# Patient Record
Sex: Female | Born: 1975 | Race: White | Hispanic: No | Marital: Married | Smoking: Former smoker
Health system: Southern US, Community
[De-identification: ages and names within clinical notes are randomized; demographics above are authoritative.]

## PROBLEM LIST (undated history)

## (undated) DIAGNOSIS — G459 Transient cerebral ischemic attack, unspecified: Secondary | ICD-10-CM

## (undated) DIAGNOSIS — N809 Endometriosis, unspecified: Secondary | ICD-10-CM

## (undated) DIAGNOSIS — E282 Polycystic ovarian syndrome: Secondary | ICD-10-CM

## (undated) DIAGNOSIS — F329 Major depressive disorder, single episode, unspecified: Secondary | ICD-10-CM

## (undated) DIAGNOSIS — I10 Essential (primary) hypertension: Secondary | ICD-10-CM

## (undated) DIAGNOSIS — D6851 Activated protein C resistance: Secondary | ICD-10-CM

## (undated) DIAGNOSIS — F419 Anxiety disorder, unspecified: Secondary | ICD-10-CM

## (undated) DIAGNOSIS — F32A Depression, unspecified: Secondary | ICD-10-CM

## (undated) HISTORY — PX: TYMPANOSTOMY TUBE PLACEMENT: SHX32

## (undated) HISTORY — DX: Anxiety disorder, unspecified: F41.9

## (undated) HISTORY — DX: Depression, unspecified: F32.A

## (undated) HISTORY — DX: Major depressive disorder, single episode, unspecified: F32.9

## (undated) HISTORY — DX: Activated protein C resistance: D68.51

## (undated) HISTORY — PX: OTHER SURGICAL HISTORY: SHX169

## (undated) HISTORY — DX: Polycystic ovarian syndrome: E28.2

## (undated) HISTORY — DX: Transient cerebral ischemic attack, unspecified: G45.9

## (undated) HISTORY — DX: Endometriosis, unspecified: N80.9

## (undated) HISTORY — DX: Essential (primary) hypertension: I10

---

## 2000-05-09 HISTORY — PX: CHOLECYSTECTOMY: SHX55

## 2003-10-08 ENCOUNTER — Emergency Department (HOSPITAL_COMMUNITY): Admission: EM | Admit: 2003-10-08 | Discharge: 2003-10-09 | Payer: Self-pay | Admitting: Emergency Medicine

## 2008-02-26 ENCOUNTER — Encounter: Admission: RE | Admit: 2008-02-26 | Discharge: 2008-04-11 | Payer: Self-pay | Admitting: Family Medicine

## 2008-07-22 ENCOUNTER — Ambulatory Visit: Payer: Self-pay | Admitting: Occupational Medicine

## 2008-07-22 DIAGNOSIS — K146 Glossodynia: Secondary | ICD-10-CM

## 2009-07-21 ENCOUNTER — Other Ambulatory Visit: Admission: RE | Admit: 2009-07-21 | Discharge: 2009-07-21 | Payer: Self-pay | Admitting: Obstetrics & Gynecology

## 2009-07-21 ENCOUNTER — Ambulatory Visit: Payer: Self-pay | Admitting: Obstetrics & Gynecology

## 2009-07-22 ENCOUNTER — Encounter: Payer: Self-pay | Admitting: Obstetrics & Gynecology

## 2009-07-22 LAB — CONVERTED CEMR LAB
DHEA-SO4: 163 ug/dL (ref 35–430)
hCG, Beta Chain, Quant, S: <2 milliintl units/mL

## 2009-07-29 ENCOUNTER — Encounter: Admission: RE | Admit: 2009-07-29 | Discharge: 2009-07-29 | Payer: Self-pay | Admitting: Obstetrics & Gynecology

## 2009-08-05 ENCOUNTER — Ambulatory Visit: Payer: Self-pay | Admitting: Obstetrics & Gynecology

## 2009-08-05 LAB — CONVERTED CEMR LAB: Hgb A1c MFr Bld: 5.4 % (ref 4.6–6.1)

## 2009-08-18 ENCOUNTER — Ambulatory Visit: Payer: Self-pay | Admitting: Obstetrics & Gynecology

## 2010-05-30 ENCOUNTER — Encounter: Payer: Self-pay | Admitting: Obstetrics & Gynecology

## 2010-07-09 ENCOUNTER — Ambulatory Visit (INDEPENDENT_AMBULATORY_CARE_PROVIDER_SITE_OTHER): Payer: Medicaid Other | Admitting: Behavioral Health

## 2010-07-09 DIAGNOSIS — F331 Major depressive disorder, recurrent, moderate: Secondary | ICD-10-CM

## 2010-07-09 DIAGNOSIS — F411 Generalized anxiety disorder: Secondary | ICD-10-CM

## 2010-07-20 ENCOUNTER — Encounter (INDEPENDENT_AMBULATORY_CARE_PROVIDER_SITE_OTHER): Payer: Medicaid Other | Admitting: Behavioral Health

## 2010-07-20 DIAGNOSIS — F331 Major depressive disorder, recurrent, moderate: Secondary | ICD-10-CM

## 2010-07-20 DIAGNOSIS — F411 Generalized anxiety disorder: Secondary | ICD-10-CM

## 2010-08-04 ENCOUNTER — Encounter (INDEPENDENT_AMBULATORY_CARE_PROVIDER_SITE_OTHER): Payer: Medicaid Other | Admitting: Behavioral Health

## 2010-08-04 DIAGNOSIS — F331 Major depressive disorder, recurrent, moderate: Secondary | ICD-10-CM

## 2010-08-04 DIAGNOSIS — F411 Generalized anxiety disorder: Secondary | ICD-10-CM

## 2010-08-18 ENCOUNTER — Encounter (HOSPITAL_COMMUNITY): Payer: Medicaid Other | Admitting: Behavioral Health

## 2010-09-01 ENCOUNTER — Encounter (INDEPENDENT_AMBULATORY_CARE_PROVIDER_SITE_OTHER): Payer: Medicaid Other | Admitting: Behavioral Health

## 2010-09-01 DIAGNOSIS — F331 Major depressive disorder, recurrent, moderate: Secondary | ICD-10-CM

## 2010-09-12 IMAGING — US US TRANSVAGINAL NON-OB
1 series · 14 of 25 positions shown · non-contrast
Comparison: None

CLINICAL DATA: Amenorrhea.  Pelvic pain. Obesity.  Polycystic
ovarian syndrome.

TRANSABDOMINAL AND TRANSVAGINAL ULTRASOUND OF PELVIS
TECHNIQUE: Both transabdominal and transvaginal ultrasound
examinations of the pelvis were performed including evaluation of
the uterus, ovaries, adnexal regions, and pelvic cul-de-sac.

[Series 1: us transvaginal non-ob · 0.26mm/px · 14 of 44 slices shown]
[im 1/44]
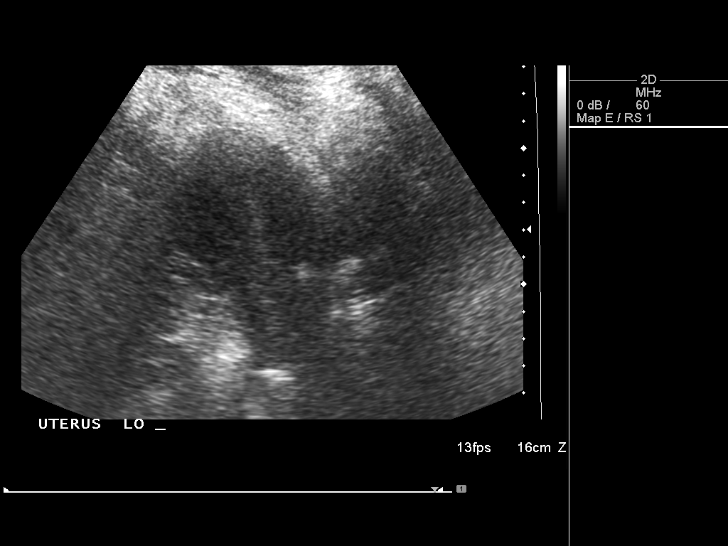
[im 4/44]
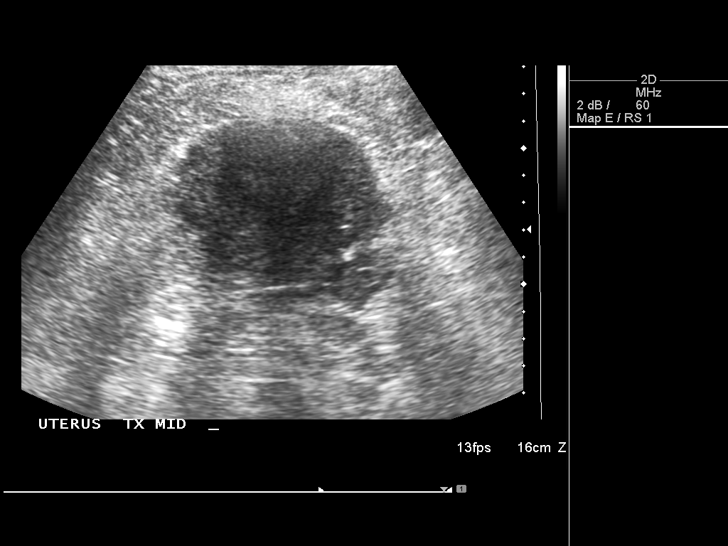
[im 8/44]
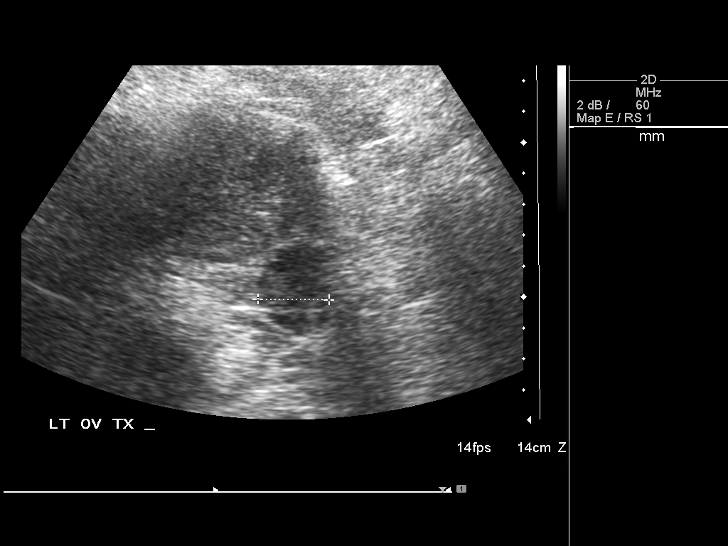
[im 11/44]
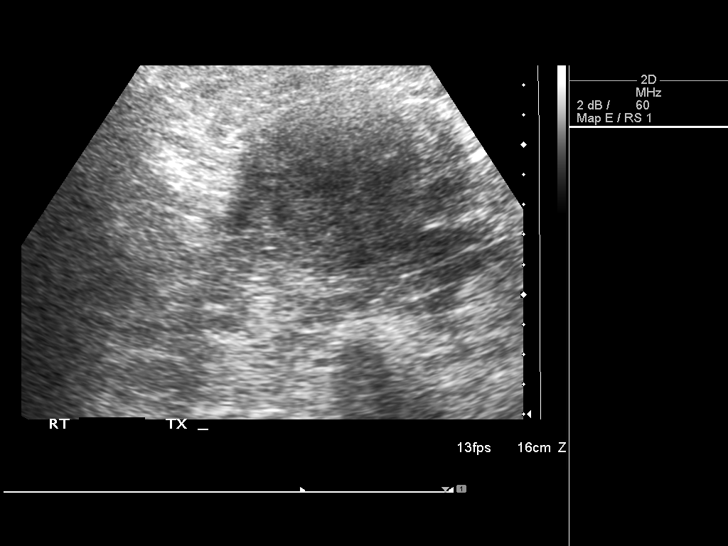
[im 15/44]
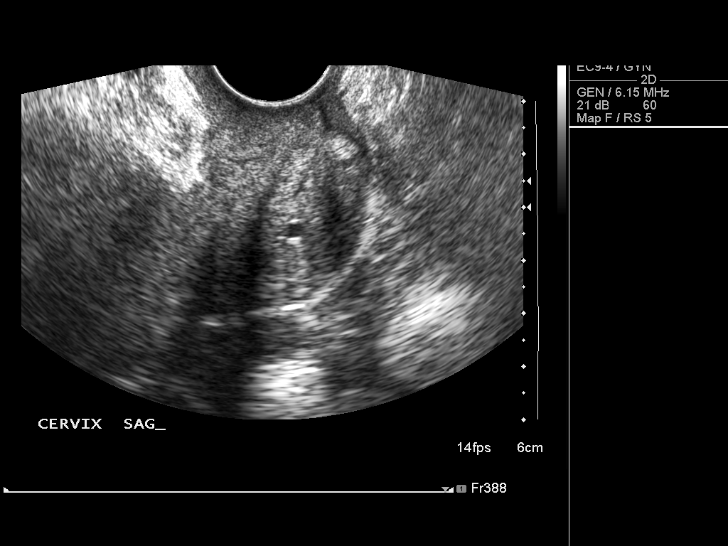
[im 17/44]
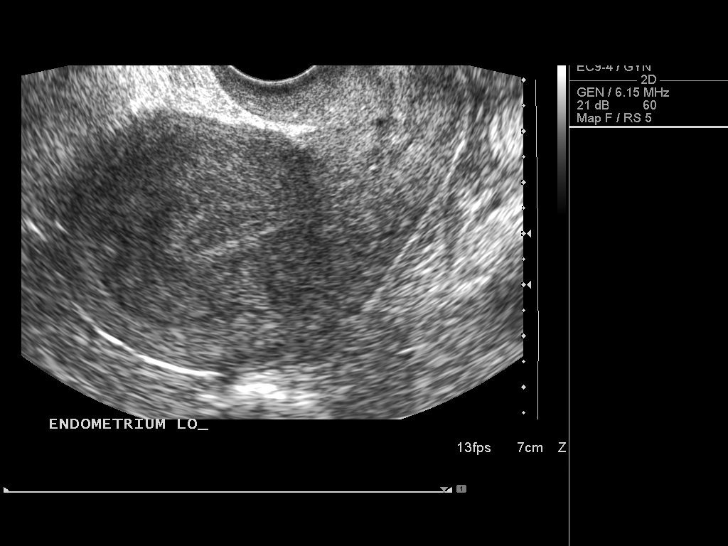
[im 20/44]
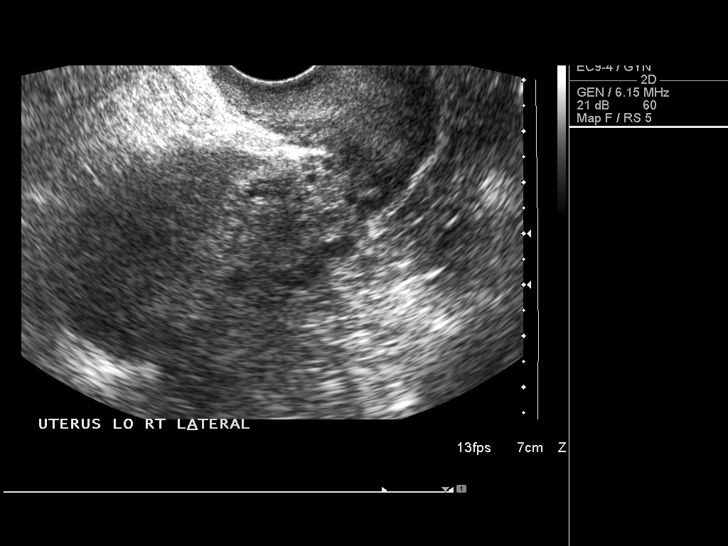
[im 24/44]
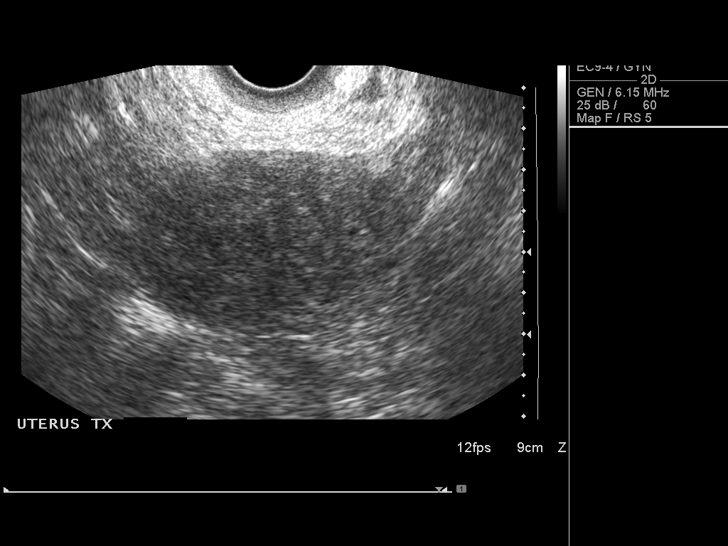
[im 27/44]
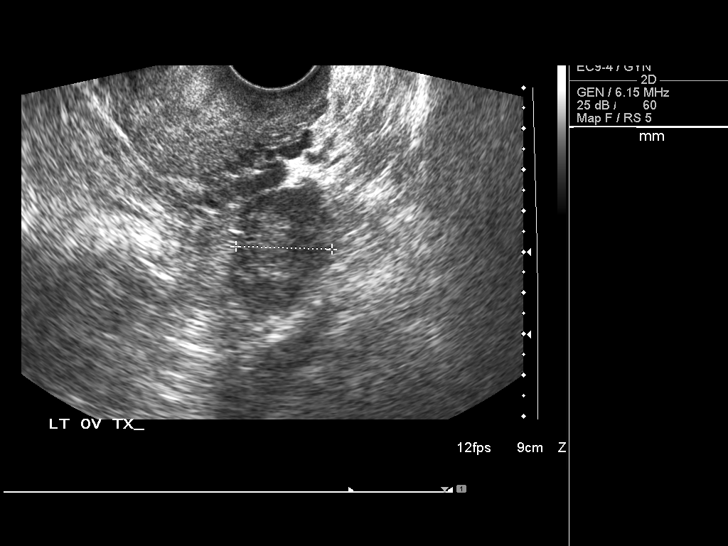
[im 29/44]
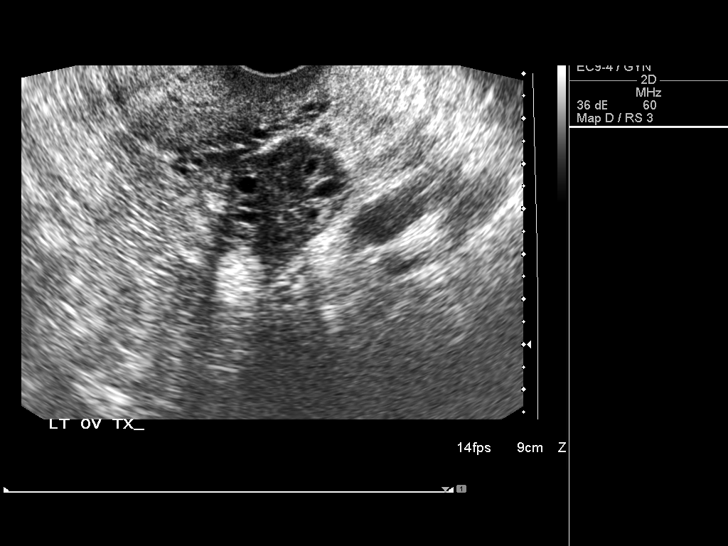
[im 33/44]
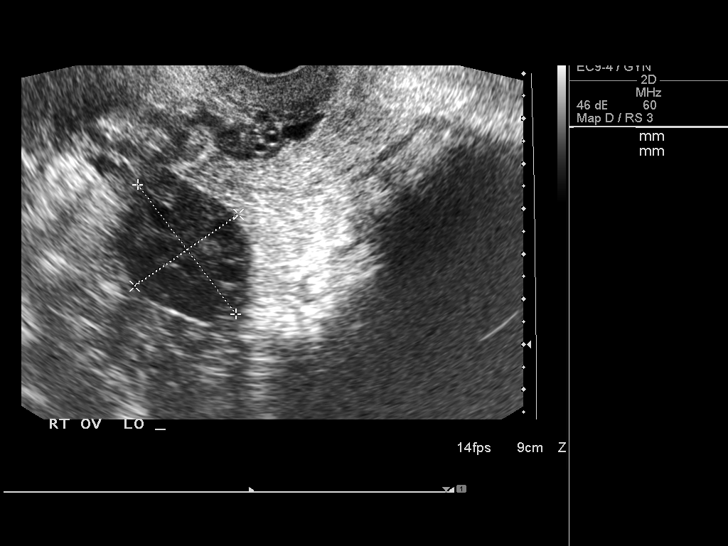
[im 36/44]
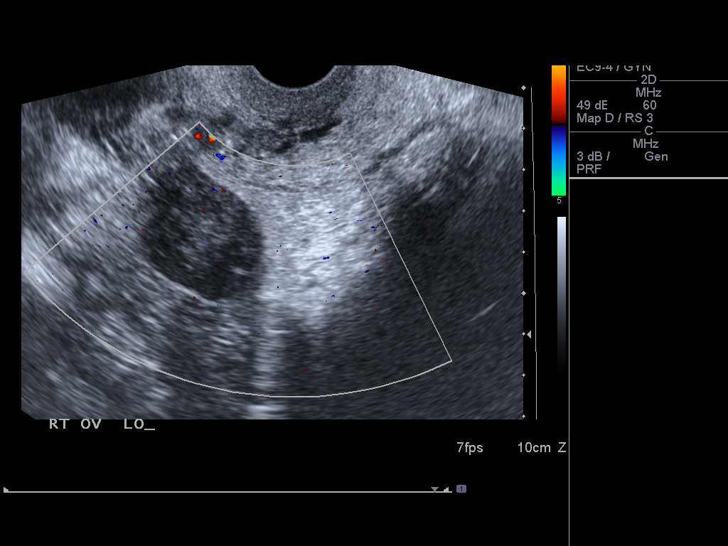
[im 40/44]
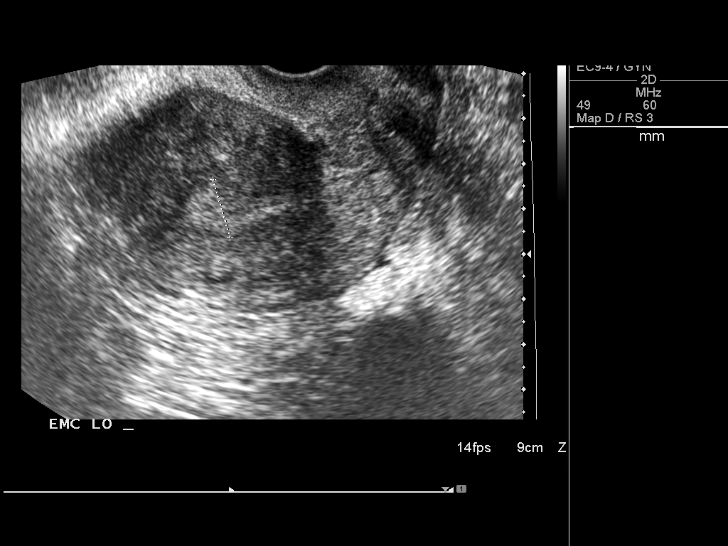
[im 44/44]
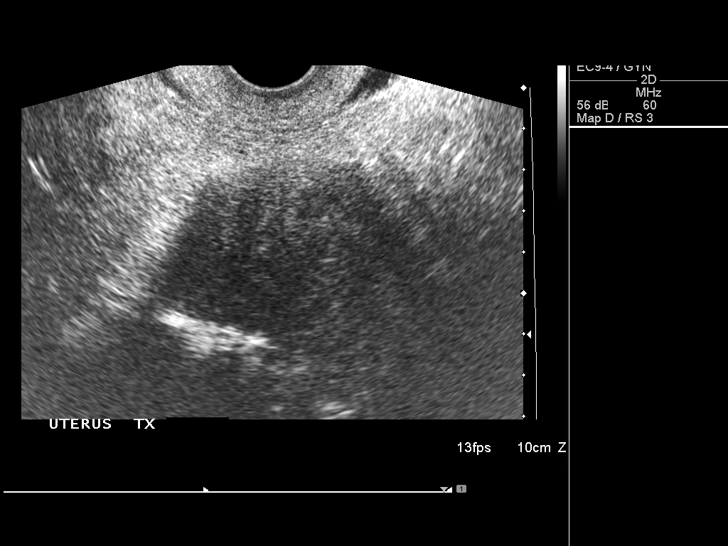

[14 of 25 positions shown; findings below may reference images not displayed]

FINDINGS: Uterus measures 9.7 x 4.9 x 7.0 cm. No fibroids or other uterine
masses identified.

Endometrium measures 13 mm in thickness.  Within normal limits in
appearance.

Right Ovary measures 3.6 x 2.8 x 2.4 cm. Normal appearance.
Multiple tiny less than 5 mm follicles noted, without evidence of
dominant follicle or corpus luteum.

Left Ovary measures 3.1 x 3.2 x 2.3 cm.  Normal appearance.
Multiple tiny less than 5 mm follicles noted, without evidence of
dominant follicle or corpus luteum.

Other Findings:  No adnexal mass or free fluid identified.
IMPRESSION: 1.  Multiple tiny bilateral ovarian follicles, without evidence of
dominant follicle or corpus luteum.  These findings are consistent
with polycystic ovarian syndrome.
2.  Endometrial thickness measures 13 mm.
3.  No evidence of fibroids or adnexal mass.

## 2010-09-16 ENCOUNTER — Encounter (INDEPENDENT_AMBULATORY_CARE_PROVIDER_SITE_OTHER): Payer: Medicaid Other | Admitting: Behavioral Health

## 2010-09-16 DIAGNOSIS — F331 Major depressive disorder, recurrent, moderate: Secondary | ICD-10-CM

## 2010-09-21 NOTE — Assessment & Plan Note (Signed)
Suzanne Crawford, Suzanne Crawford                ACCOUNT NO.:  [id number]   MEDICAL RECORD NO.:  [id number]          PATIENT TYPE:  POB   LOCATION:  [facility] at [place]         FACILITY:  [facility]   PHYSICIAN:  Elsie Lincoln, MD      DATE OF BIRTH:  June 10, 1975   DATE OF SERVICE:  07/21/2009                                  CLINIC NOTE   The patient is a 35 year old female who presents for GYN visit.  This  patient has not seen a GYN since 2006.  She has complaints of pelvic  pain, irregular cycles, and a history of PCOS.  I decided to do a yearly  exam and will address all of her complaints at future visits, first saw  the patient because 4-5 months without having a cycle and then a very  heavy bleeding.  She has pelvic pain that can be severe at times, but is  not currently severe.  She is worried that she has some type of cancer.  Her mother had hysterectomy very early for a very rare GYN cancer, but  she does not know what it is.  Lot of her aunts and cousins have had  hysterectomies for cervical cancer and ruptured cyst, but not ovarian or  uterine cancer that she is aware of other than her mother having a rare  cancer.  She said her mother will not be able to figure out what type of  cancer she had.  The patient is not sexually active.   PAST MEDICAL HISTORY:  Complex.  The patient has high blood pressure,  some anxiety, and emotional eating.  She has psoriasis and has had  recent recurrent cellulitis in her legs and currently has cellulitis and  is going to her primary care doctor today to get thorough realm of  antibiotics.  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  She is also morbidly obese.   PAST SURGICAL HISTORY:  She has had a C-section for what is supposed to  be a breech delivery, ended to be vertex when they cut her.  She has had  three ear surgeries, had tumors and tubes placed in her ears.  She also  has had a cholecystectomy.   GYNECOLOGIC  HISTORY:  Significant for polycystic ovarian syndrome with  irregular cycles most of her life.  Most recently, she had cycles every  4-5 months with very heavy bleeds.  She has had ovarian cyst in the  past.  Her last Pap smear was abnormal in 2006, but not good followup.  She has had PID in the late 90s.  She denies any other sexually  transmitted diseases.  She has never been told she has had fibroid  tumors of the uterus.  She has never been told she has had  endometriosis.  She is not currently sexually active.  She was in a  heterosexual relationship.  She seems she has had four pregnancies even  though she has had PCOS.  She had three vaginal deliveries and one C-  section.   SOCIAL HISTORY:  She is not employed.  She lives with her mother and 4  kids.  She does not smoke, drink alcohol, or do drugs.  She has not been  a victim of sexual or physical abuse.   FAMILY HISTORY:  Significant for diabetes in her aunt and uncles, heart  disease in her father, high blood pressure in her mother and father.  Her mother had some type of cancer in her uterus and ovaries, but she  does not know exactly what.  Also, mother had a blood clot in her lung  during chemotherapy.  Her mother also had a non-Hodgkin's lymphoma.   MEDICATIONS:  Hydrochlorothiazide, lisinopril, Lasix, Xanax, Percocet.   ALLERGIES:  Include CODEINE, MORPHINE, ASPIRIN, SEA FOOD.  She is not  allergic to Lasix.   Her review is positive for weakness, fatigue, hot flashes, anxiety,  headaches, dizziness, lightheadedness, leg swelling, abdominal pain,  vaginal discharge.   PHYSICAL EXAMINATION:  VITAL SIGNS:  Pulse 87, blood pressure 143/99,  weight 321, height 5 feet 7 inches.  GENERAL:  Well nourished, well developed in no apparent distress.  HEENT:  Normocephalic, atraumatic.  Teeth crowding.  Thyroid, no masses.  The patient had a recent TSH drawn this week, which she said was normal.  LUNGS:  Clear to auscultation  bilaterally.  HEART:  Regular rate and rhythm.  BREASTS:  No masses, nontender.  No nipple discharge.  No  lymphadenopathy.  ABDOMEN:  Obese, soft, nontender.  No organomegaly, no hernia.  GENITALIA:  Tanner V, large skin tag of the right buttocks cheek.  Vagina pink, normal rugae.  There seems to be a 3-quarter centimeter  ulcer posterior to the cervix was nontender.  We will test for herpes.  Cervix closed, nontender.  Uterus and adnexa are nontender, but  difficult to palpate secondary to habitus.  She also appears to have  small hemorrhoid.  No cystocele nor rectocele.  EXTREMITIES:  Lower lower extremities have psoriasis and developing  cellulitis.  There is also some edema in her lower extremities.   ASSESSMENT/PLAN:  A 35 year old female with multiple medical problems in  need of a GYN exam.  1. Pap smear done.  2. Transvaginal ultrasound ordered to evaluate pelvic pain and      amenorrhea.  3. The patient needs to come back for endometrial biopsy.  4. The patient needs to come back for a vaginal biopsy.  5. 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  6. The patient will come back in a week for the first biopsy.           ______________________________  Elsie Lincoln, MD     KL/MEDQ  D:  07/21/2009  T:  07/22/2009  Job:  [id number]

## 2010-09-21 NOTE — Assessment & Plan Note (Signed)
Suzanne Crawford, Suzanne Crawford                ACCOUNT NO.:  [id number]   MEDICAL RECORD NO.:  [id number]          PATIENT TYPE:  POB   LOCATION:  [facility] [place]         FACILITY:  [facility]   PHYSICIAN:  Elsie Lincoln, MD      DATE OF BIRTH:  1976/04/09   DATE OF SERVICE:  08/18/2009                                  CLINIC NOTE   The patient is a 35 year old female who presents for biopsy of her  vagina.  An ulcer was seen in her vagina at her annual visit.  Today,  the ulcer is seen posterior to the cervix that is difficult to isolate  through the redundancy at the vagina.  We are having to go to do D and  C, polypectomy, and hydrothermal ablation due to the heavy periods and  polyp found on biopsy.  I explained to the patient that we could  complete this biopsy with larger retractors and more personnel in the  operating room.  It would also be more comfortable for the patient under  anesthesia.  We will complete this biopsy in the operating room.   The patient is hypertensive today, 175/104.  The patient is anxious  about this procedure.  Hopefully, her blood pressure will be more  controlled that day, yet at last visit, her blood pressure was 146/98.   She does still have cellulitis and swelling in her lower extremities.  We referred to her dermatologist, they refused to see her because she  was no show.  We will try to refer her to another dermatologist.   ASSESSMENT AND PLAN:  A 35 year old female with polycystic ovary and  endometrial polyp on biopsy.  The patient was consented for exam under  anesthesia, biopsy of a vaginal lesion, D and C, hysteroscopy,  polypectomy, and hydrothermal ablation.  Risk of these procedure include  but not limited to bleeding, infection, damage to the back of the  uterus, and a burn on the vagina.  The patient accepts all these risks.  The patient does have sleep apnea and this was circled on the  preoperative orders sent to [facility] [place].   The patient  is hypertensive today, but she was almost normotensive at the last  visit, this is due to her anxiety of the biopsy that she was supposed to  get today.           ______________________________  Elsie Lincoln, MD     KL/MEDQ  D:  08/18/2009  T:  08/19/2009  Job:  [id number]

## 2010-09-21 NOTE — Assessment & Plan Note (Signed)
NAMEDOROTHA, HIRSCHI                ACCOUNT NO.:  [id number]   MEDICAL RECORD NO.:  [id number]          PATIENT TYPE:  POB   LOCATION:  [facility] [place]         FACILITY:  [facility]   PHYSICIAN:  Elsie Lincoln, MD      DATE OF BIRTH:  April 16, 1976   DATE OF SERVICE:  08/05/2009                                  CLINIC NOTE   The patient is a 35-year female who presents for results of endometrial  biopsy.  The patient was last seen here for her yearly exam.  She also  has complaints of pelvic pain, irregular cycle, and history of PCOs.  We  did draw labs.  Her hemoglobin A1c is 5.4.  Her 17  OHP is pending.  Her  FSH is 7.7.  Her beta-hCG was negative less than 2.  Her DHEA sulfate  was 163.  She was noted to have this ulcer in the vagina posterior to  the cervix nor HSV-2.  Glycoprotein IgG was negative.  Her Pap smear was  normal.  Her ultrasound showed a uterus that measured 9.7 x 4.9 x 7.0.  Her endometrial stripe was 13-mm.  Her right and left ovaries were  normal size with multiple tiny less than 5-mm follicles with no dominant  follicle or corpus luteum consistent with PCO.  The patient needed an  endometrial biopsy for a history of amenorrhea and history of morbid  obesity and also for history of PCO.  She was consented for the  endometrial biopsy.  Separate procedure for the endometrial biopsy  follows.  After informed consent was obtained, the patient was placed in  dorsal lithotomy position.  A speculum was placed into the vagina and  the cervix was brought into view.  The cervix was cleaned with Betadine  and the anterior lip of the cervix was grasped with single-tooth  tenaculum.  The uterus sounded to 9 cm and 2 passes were made with the  endometrial Pipelle.  Good tissue was obtained.  Good hemostasis was  noted at the cervix after the procedure.  The patient tolerated the  procedure well.   ASSESSMENT AND PLAN:  A 35 year old female with polycystic ovary and  irregular  cycles with all labs back except for 17 OHP.  1. It seems that her regular cycles are due to PCO.  We will hopefully      be able to cycle her on OCPs.  She does have high blood pressure.      We also may be able to do a Mirena IUD.  We will wait for the      result of her biopsy and go over options with the patient.  2. The patient will need a biopsy of the ulcer if it is still present.      We will check and do another speculum exam in 2 weeks when she      comes back for results.  3. The patient has marked cellulitis of her lower extremities with      swelling of the left dorsum of the foot.  Her family practice      doctor is treating her with steroid creams, but I think  she might      need antibiotic.  I am going to refer her to a dermatologist.           ______________________________  Elsie Lincoln, MD     KL/MEDQ  D:  08/05/2009  T:  08/06/2009  Job:  [id number]

## 2010-10-06 ENCOUNTER — Encounter (INDEPENDENT_AMBULATORY_CARE_PROVIDER_SITE_OTHER): Payer: Medicaid Other | Admitting: Behavioral Health

## 2010-10-06 DIAGNOSIS — F331 Major depressive disorder, recurrent, moderate: Secondary | ICD-10-CM

## 2010-10-20 ENCOUNTER — Encounter (INDEPENDENT_AMBULATORY_CARE_PROVIDER_SITE_OTHER): Payer: Medicaid Other | Admitting: Behavioral Health

## 2010-10-20 DIAGNOSIS — F331 Major depressive disorder, recurrent, moderate: Secondary | ICD-10-CM

## 2010-11-19 ENCOUNTER — Encounter (HOSPITAL_COMMUNITY): Payer: Medicaid Other | Admitting: Behavioral Health

## 2010-12-16 ENCOUNTER — Encounter (INDEPENDENT_AMBULATORY_CARE_PROVIDER_SITE_OTHER): Payer: Medicaid Other | Admitting: Behavioral Health

## 2010-12-16 DIAGNOSIS — F331 Major depressive disorder, recurrent, moderate: Secondary | ICD-10-CM

## 2010-12-30 ENCOUNTER — Encounter (INDEPENDENT_AMBULATORY_CARE_PROVIDER_SITE_OTHER): Payer: Medicaid Other | Admitting: Behavioral Health

## 2010-12-30 DIAGNOSIS — F331 Major depressive disorder, recurrent, moderate: Secondary | ICD-10-CM

## 2011-01-13 ENCOUNTER — Encounter (INDEPENDENT_AMBULATORY_CARE_PROVIDER_SITE_OTHER): Payer: Medicaid Other | Admitting: Behavioral Health

## 2011-01-13 DIAGNOSIS — F332 Major depressive disorder, recurrent severe without psychotic features: Secondary | ICD-10-CM

## 2011-01-28 ENCOUNTER — Encounter (INDEPENDENT_AMBULATORY_CARE_PROVIDER_SITE_OTHER): Payer: Medicaid Other | Admitting: Behavioral Health

## 2011-01-28 DIAGNOSIS — F332 Major depressive disorder, recurrent severe without psychotic features: Secondary | ICD-10-CM

## 2011-02-14 ENCOUNTER — Encounter (INDEPENDENT_AMBULATORY_CARE_PROVIDER_SITE_OTHER): Payer: Medicaid Other | Admitting: Behavioral Health

## 2011-02-14 DIAGNOSIS — F332 Major depressive disorder, recurrent severe without psychotic features: Secondary | ICD-10-CM

## 2011-04-07 ENCOUNTER — Ambulatory Visit (INDEPENDENT_AMBULATORY_CARE_PROVIDER_SITE_OTHER): Payer: Medicaid Other | Admitting: Behavioral Health

## 2011-04-07 DIAGNOSIS — F332 Major depressive disorder, recurrent severe without psychotic features: Secondary | ICD-10-CM

## 2011-04-08 NOTE — Progress Notes (Signed)
   THERAPIST PROGRESS NOTE  Session Time: 10:00  Participation Level: Active  Behavioral Response: Well GroomedAlertDepressed  Type of Therapy: Individual Therapy  Treatment Goals addressed: depression  AND STRESSORS  Interventions: CBT  Summary: Suzanne Crawford is a 35 y.o. female who presents with DEPRESSION.   Suicidal/Homicidal: Nowithout intent/plan  Therapist Response: The client presented as flat in affect. She indicated that her health issues with the biggest stressor. The edema has returned to her legs and she indicated that her medical doctor indicated that he would admit her to the hospital on Friday, November 30 to begin more intensive treatment. The client indicated that her medical doctor wrote her husband out of work for 2 weeks so that he could take care of the family in a house. The client indicates that she is in significant pain and difficulty walking back to my office. We did provide a wheelchair to take her back to her car. She indicated that she has gotten herself in pretty good shape at home so that if she is in the hospital for a few days he can be managed. She did indicate that one of her teenage girls who has been living in the home with her left but came back home with district understanding that if she makes any additional is that she will not be allowed to return. She indicates that the female has been helpful for she and the family over the past 2 days. She did indicate that the female who that the young child and is pregnant is now living with her grandfather so a stressor is reduced. She did indicate that she has applied for disability and was turned down one time but her attorney feels that she will be accepted based on multiple physical health issues. She did indicate that her attorney ask her son release to speak to me he may be requesting records. The client indicated that her husband has been helpful and her mother and father have helped as much is possible but  that she has a limited support group. She indicated that most of her friends at church have enough other own issues and are able to help her at this point. The client does realize that she needs to take care of herself physically in the level of impact as her depression. She rates that currently at a 7 or 8/10 but recognizes it has increased since her physical health issues have worsened.. I asked the client to keep me informed as to whether she is admitted to the hospital and to her resolution or management of physical health issues.  Plan: Return again in 2 weeks.  Diagnosis: Axis I: 296.33    Axis II: Deferred    [name], [facility] 04/08/2011

## 2011-04-12 ENCOUNTER — Ambulatory Visit (HOSPITAL_COMMUNITY): Payer: Medicaid Other | Admitting: Behavioral Health

## 2011-04-21 ENCOUNTER — Encounter (HOSPITAL_COMMUNITY): Payer: Self-pay | Admitting: Behavioral Health

## 2011-05-11 ENCOUNTER — Ambulatory Visit (INDEPENDENT_AMBULATORY_CARE_PROVIDER_SITE_OTHER): Payer: BC Managed Care – PPO | Admitting: Behavioral Health

## 2011-05-11 DIAGNOSIS — F332 Major depressive disorder, recurrent severe without psychotic features: Secondary | ICD-10-CM

## 2011-05-11 NOTE — Progress Notes (Signed)
   THERAPIST PROGRESS NOTE  Session Time: 3:00  Participation Level: Active  Behavioral Response: Fairly GroomedAlertDepressed  Type of Therapy: Individual Therapy  Treatment Goals addressed: Diagnosis: depression  Interventions: CBT  Summary: Suzanne Crawford is a 36 y.o. female who presents with depression.   Suicidal/Homicidal: Nowithout intent/plan  Therapist Response: The client entered the session with her walking gait obviously altered. She indicated that her cellulitis have flared up again in her left leg and now in her right leg. She indicated that the was significant fever in the leg. She indicated that because of the infection she has been vomiting and cannot keep food down and has lost 22 pounds in the last month. She has been to her medical doctor and his partner who had increased her pain medications and giving her injections to help with the pain. She indicated that the partner and her doctors medical practices told her that the cellulitis was not better by Friday of this week that he would put her in the hospital. He has a data through the hospital now because her so much flew in the hospital and knows that her immunity system is compromised. The patient is obviously in physical distress which certainly has an effect on her mentally. She has maintained a sense of humor but indicates that she did laugh she would cry. Indicates she is in constant pain and is not sleeping. She indicated that last night she found out to sleep at 4:00 and woke up at 5:30. He indicated that her medical doctor prescribe something for sleep which is not helping any. He did indicate that her husband will be off of work tomorrow to help her and the 2 friends have helped with keeping the children. She indicated that now fairly severe financial issues have gotten worse. She indicated later one and one half months behind on rent and their husbands entire next check will go strictly to read and not radiating else.  The client has applied for disability him and about twice per her attorney seems to think that there is a possibility she will be approved this time but that would not kick in 4 months. The client receives some financial and gets assistance for Christmas for her children but indicates she has contacted all agencies that she is aware of and there is no additional financial assistance now. She did get some food assistance when they moved in August but is not eligible for food assistance again to the end of January. She will call  and see if they may accelerate the process. She indicates they are seems some financial assistance from churches I suggested that she try some others. I will see to my appears to see if they have any other resources that may not be aware of. I offered relaxation CD but she  indicated she has tried that in the past and they have not helped. We talked about progressive muscle relaxation and guided imagery as well as possibly helping with sleep and pain reduction the client has maintained a positive attitude in spite of physical and financial issues which certainly have contributed to her depression.  Plan: Return again in 1 weeks.  Diagnosis: Axis I: 296.33    Axis II: Deferred    [name], [facility] 05/11/2011

## 2011-05-19 ENCOUNTER — Ambulatory Visit (HOSPITAL_COMMUNITY): Payer: Self-pay | Admitting: Behavioral Health

## 2011-05-20 ENCOUNTER — Ambulatory Visit (HOSPITAL_COMMUNITY): Payer: BC Managed Care – PPO | Admitting: Behavioral Health

## 2011-05-20 DIAGNOSIS — F339 Major depressive disorder, recurrent, unspecified: Secondary | ICD-10-CM

## 2011-05-20 NOTE — Progress Notes (Unsigned)
THERAPIST PROGRESS NOTE  Session Time: 11:00  Participation Level: Active  Behavioral Response: Fairly GroomedAlertDepressed  Type of Therapy: Individual Therapy  Treatment Goals addressed: Coping  Interventions: CBT  Summary: Suzanne Crawford is a 36 y.o. female who presents with depression.   Suicidal/Homicidal: Nowithout intent/plan  Therapist Response: I met with the client indicates that she is experiencing increased depression and she indicated that she had received her second rejection for her disability application. She indicated this rejection only took about 3 months from the time of recently in the application. She indicates that her attorney remains optimistic in indicates that the second dismissal taking place quickly maybe a good sign. Indicates that the next step will be a hearing. I told client that she had enough physical issues thought she would be more than qualified. She indicated that in the first rejection she was told she was able to work. She indicated that there was no mention of her being able to work in his rejection but the fact that she can take care of herself and get her self appointments is what kept her from being qualified. She indicated that her attorney may contact me for possible relinquishment of records. The client indicated that her medical doctor has given all the information that is needed to the attorney so she does have some optimism of being approved for disability in the next try. 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 The client has a history of tumors in her ear which have had to be surgically repaired or tubes put in. She indicated that it had been bad enough for the past 2 weeks because she was having difficulty hearing but now she is beginning to experience pain in both years. She indicates she is calling her doctor again this afternoon to see if he can refer to her  hearing specialist. She indicates that she has had surgery to remove a tumor from her ears a couple of occasions. She indicated that one surgery the only known her ears and did not put her to sleep because of multiple health issues. She indicated she did not know she could stand again because you can hear everything is going on in the work that they're doing. She indicated additional financial stressors because she's not able to work and her husband makes a limited amount of income. She indicated that several of her children have been ill so she's had to get help from friends and her pastor and taking care of the children since she has not physically been able to do so. We talked at length about coping skills for her depression which he is practicing use she indicated that she is not sleeping well and none of the medications that her medical doctor had prescribed been helpful. She indicates that she will sleep for an hour to an awake up for an hour to get back to sleep for an hour or 2. She indicates that she may sleep for 5 hours at night but it's interrupted having difficulty getting up has made getting her kids school time an issue. The client does have multiple health issue concerns and does legitimately appear to be suffering from moderate to severe depression. We have move her appointment subcutaneous weekly she did contract for safety indicating that she has no suicidal ideation or homicidal ideation. She recognizes commitment to family and knows she is a good medical Dr. he will take the  steps necessary to bring some relief to her.  Plan: Return again in 1 weeks.  Diagnosis: Axis I: 296.32    Axis II: Deferred    [name], [facility] 05/20/2011

## 2011-06-02 ENCOUNTER — Ambulatory Visit (HOSPITAL_COMMUNITY): Payer: Self-pay | Admitting: Behavioral Health

## 2011-06-15 ENCOUNTER — Ambulatory Visit (INDEPENDENT_AMBULATORY_CARE_PROVIDER_SITE_OTHER): Payer: BC Managed Care – PPO | Admitting: Behavioral Health

## 2011-06-15 DIAGNOSIS — F332 Major depressive disorder, recurrent severe without psychotic features: Secondary | ICD-10-CM

## 2011-06-16 ENCOUNTER — Encounter (HOSPITAL_COMMUNITY): Payer: Self-pay | Admitting: Behavioral Health

## 2011-06-16 DIAGNOSIS — F332 Major depressive disorder, recurrent severe without psychotic features: Secondary | ICD-10-CM | POA: Insufficient documentation

## 2011-06-16 NOTE — Progress Notes (Signed)
THERAPIST PROGRESS NOTE  Session Time: 11:00  Participation Level: Active  Behavioral Response: CasualAlertDepressed  Type of Therapy: Individual Therapy  Treatment Goals addressed: Coping  Interventions: CBT  Summary: Suzanne Crawford is a 36 y.o. female who presents with depression.   Suicidal/Homicidal: Nowithout intent/plan  Therapist Response: The client entered the session walking as if she was in pain. I inquired into was going on with her. She indicated that her fibromyalgia had been acting up and mature other associated physical health disorders that she was dealing with. 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 She indicated that there was an insurance issue and that she could not have the surgery was scheduled. She indicates that his schedule again for February 15 and hopes that will work out. 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 She also indicates that she in speaking with her medical doctor has had what he thinks are either mild heart attacks or mild strokes at least 3 or 4 in the past month or so. The client has a meeting with a cardiologist on February 11. The client also indicated that she isn't having panic attacks and that she medical Dr. having a difficult time discerning if the physical health issues are contributing to the panic anxiety with panic anxiety is a difficult indistinct issue. It is difficult to tell us client does have multiple health issues. The client also has significant responsibilities around the home which she says she is getting minimal help with. Her husband is as helpful as he can be in still works. She indicated that her best friend has her own issues now cannot help much and that her younger children of not being very helpful for. She indicated that her older sons are somewhat helpful but that the  female who stay in the home came close to being kicked out because she reported that the client and her family was not being nice to her. The client indicated to take she gave the female 2 options to participate and be a part of the family with that she could treat her like she was a boarding house residents and not have any activity within the family. She indicated that she is tired of the ins and outs of the female and that she will be moving out the day after she graduates from high school on June 9. The client does appear he setting better boundaries as far as her responsibilities around the house. She indicates that she cannot clean like she use to an even though is difficult to see the mess around the house she is living somewhat ago because she physically cannot do what she used to do. She indicated that her doctor has advised her not to bend over or squat for fear that she might pass out or have a stroke. This certainly contribute to the clients depression and she was as flat as I have seen her in the past few sessions. We talked about coping skills for dealing with anxiety as well as depression. She also indicated that she is not sleeping. I suggested multiple coping skills for helping her to relax asleep but she indicated that she had tried all of them and none of them had brought any benefit. The client apparently feels that she will not get much relief from anxiety and depression until she can make  some physical changes and has some closure on what is going on with a possible heart attack and/or strokes. I will attempt to meet with the client weekly as possible in relation to her physical limitations. She did contract for safety indicating she is not suicidal or homicidal  Plan: Return again in 2 weeks.  Diagnosis: Axis I: 296.3    Axis II: Deferred    [name], LPC 06/16/2011

## 2011-06-21 DIAGNOSIS — M797 Fibromyalgia: Secondary | ICD-10-CM | POA: Insufficient documentation

## 2011-06-21 DIAGNOSIS — E668 Other obesity: Secondary | ICD-10-CM | POA: Insufficient documentation

## 2011-06-21 DIAGNOSIS — I10 Essential (primary) hypertension: Secondary | ICD-10-CM | POA: Insufficient documentation

## 2011-06-23 ENCOUNTER — Encounter (HOSPITAL_COMMUNITY): Payer: Self-pay | Admitting: Behavioral Health

## 2011-06-23 ENCOUNTER — Ambulatory Visit (INDEPENDENT_AMBULATORY_CARE_PROVIDER_SITE_OTHER): Payer: BC Managed Care – PPO | Admitting: Behavioral Health

## 2011-06-23 DIAGNOSIS — F064 Anxiety disorder due to known physiological condition: Secondary | ICD-10-CM

## 2011-06-23 NOTE — Progress Notes (Signed)
   THERAPIST PROGRESS NOTE  Session Time: 10:00  Participation Level: Active  Behavioral Response: CasualAlertAnxious  Type of Therapy: Individual Therapy  Treatment Goals addressed: Anxiety  Interventions: CBT  Summary: Suzanne Crawford is a 36 y.o. female who presents with anxiety.   Suicidal/Homicidal: Nowithout intent/plan  Therapist Response: Client to call to move her appointment up that she present with extreme anxiety from medical conditions and upcoming procedures. 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 She indicated that she has had his procedure done and that it helps at least temporarily but that she is to the point where she cannot hear out of her right ear at all and has a minimal hearing in her left ear. She indicates that has become painful she reported that in the last procedure they throughout the possibility that there could be non-Hodgkin's lymphoma. Although she has not been told that was a possibility this time she indicates that his playing in the back of her head. She reported that she also met with cardiologist to confirm her medical doctors suspicion that they probably have been several small heart attacks. She indicates that she meets with the mother cardiologist with the same cardiologist for a test on February 26. I suspect that there may be blockages which could have led to any heart attacks or the client stress level based on medical conditions and other extreme family responsibilities may be playing a part in this. The client indicates that for the test more she can take Xanax which relaxes her but is somewhat concerned that there could be non-Hodgkin's lymphoma based on what she what she was told several years ago. Assure her that he wouldn't mention this to her are what a check that they thought that was a possibility. We talked at length about relaxation exercises in addition to what she is are  using. Spent significant time keeping the client calm and preparing her for the surgery tomorrow and the procedure a heart on the 26. That procedure includes putting a chemical into her heart so they can better determine sources of damage and/or possible heart attacks. The client indicated that she was told that one can people had a heart attack when going to the procedure and she is concerned about that. Asked her to turn around and look at it is only 10% of people have a heart attack and that she will be in place for a cardiologist is right there with her and that she never will be unattended. Based on the clients medical condition I will meet with her again next week if she is well enough to do so. We reiterated any anxiety reduction techniques that we could so that she could practice in between now and surgery as well as the time of the procedure  Plan: Return again in 1 weeks.  Diagnosis: Axis I: 300.02    Axis II: Deferred    [name], [facility] 06/23/2011

## 2011-06-29 ENCOUNTER — Ambulatory Visit (INDEPENDENT_AMBULATORY_CARE_PROVIDER_SITE_OTHER): Payer: BC Managed Care – PPO | Admitting: Behavioral Health

## 2011-06-29 DIAGNOSIS — F331 Major depressive disorder, recurrent, moderate: Secondary | ICD-10-CM

## 2011-06-30 ENCOUNTER — Encounter (HOSPITAL_COMMUNITY): Payer: Self-pay | Admitting: Behavioral Health
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
## 2011-07-18 ENCOUNTER — Ambulatory Visit (HOSPITAL_COMMUNITY): Payer: Self-pay | Admitting: Behavioral Health

## 2011-07-22 ENCOUNTER — Ambulatory Visit (INDEPENDENT_AMBULATORY_CARE_PROVIDER_SITE_OTHER): Payer: BC Managed Care – PPO | Admitting: Behavioral Health

## 2011-07-22 DIAGNOSIS — F411 Generalized anxiety disorder: Secondary | ICD-10-CM

## 2011-07-22 DIAGNOSIS — F331 Major depressive disorder, recurrent, moderate: Secondary | ICD-10-CM

## 2011-07-25 ENCOUNTER — Encounter (HOSPITAL_COMMUNITY): Payer: Self-pay | Admitting: Behavioral Health
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
## 2011-08-03 ENCOUNTER — Ambulatory Visit (INDEPENDENT_AMBULATORY_CARE_PROVIDER_SITE_OTHER): Payer: BC Managed Care – PPO | Admitting: Behavioral Health

## 2011-08-03 DIAGNOSIS — F329 Major depressive disorder, single episode, unspecified: Secondary | ICD-10-CM

## 2011-08-04 ENCOUNTER — Encounter (HOSPITAL_COMMUNITY): Payer: Self-pay | Admitting: Behavioral Health
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
## 2011-08-10 ENCOUNTER — Ambulatory Visit (INDEPENDENT_AMBULATORY_CARE_PROVIDER_SITE_OTHER): Payer: BC Managed Care – PPO | Admitting: Behavioral Health

## 2011-08-10 ENCOUNTER — Encounter (HOSPITAL_COMMUNITY): Payer: Self-pay | Admitting: Behavioral Health

## 2011-08-10 DIAGNOSIS — F331 Major depressive disorder, recurrent, moderate: Secondary | ICD-10-CM

## 2011-08-10 DIAGNOSIS — F411 Generalized anxiety disorder: Secondary | ICD-10-CM
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
## 2011-08-17 ENCOUNTER — Encounter (HOSPITAL_COMMUNITY): Payer: Self-pay | Admitting: Behavioral Health

## 2011-08-17 ENCOUNTER — Ambulatory Visit (INDEPENDENT_AMBULATORY_CARE_PROVIDER_SITE_OTHER): Payer: BC Managed Care – PPO | Admitting: Behavioral Health

## 2011-08-17 DIAGNOSIS — F411 Generalized anxiety disorder: Secondary | ICD-10-CM

## 2011-08-17 DIAGNOSIS — F332 Major depressive disorder, recurrent severe without psychotic features: Secondary | ICD-10-CM
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
## 2011-08-31 ENCOUNTER — Ambulatory Visit (INDEPENDENT_AMBULATORY_CARE_PROVIDER_SITE_OTHER): Payer: BC Managed Care – PPO | Admitting: Behavioral Health

## 2011-08-31 ENCOUNTER — Encounter (HOSPITAL_COMMUNITY): Payer: Self-pay | Admitting: Behavioral Health

## 2011-08-31 DIAGNOSIS — F331 Major depressive disorder, recurrent, moderate: Secondary | ICD-10-CM

## 2011-08-31 DIAGNOSIS — F411 Generalized anxiety disorder: Secondary | ICD-10-CM
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
## 2011-09-05 ENCOUNTER — Encounter (HOSPITAL_COMMUNITY): Payer: Self-pay | Admitting: Behavioral Health

## 2011-09-05 ENCOUNTER — Ambulatory Visit (INDEPENDENT_AMBULATORY_CARE_PROVIDER_SITE_OTHER): Payer: BC Managed Care – PPO | Admitting: Behavioral Health

## 2011-09-05 DIAGNOSIS — F331 Major depressive disorder, recurrent, moderate: Secondary | ICD-10-CM

## 2011-09-05 DIAGNOSIS — F411 Generalized anxiety disorder: Secondary | ICD-10-CM
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
## 2011-09-12 ENCOUNTER — Ambulatory Visit (INDEPENDENT_AMBULATORY_CARE_PROVIDER_SITE_OTHER): Payer: BC Managed Care – PPO | Admitting: Behavioral Health

## 2011-09-12 DIAGNOSIS — F331 Major depressive disorder, recurrent, moderate: Secondary | ICD-10-CM

## 2011-09-12 DIAGNOSIS — F411 Generalized anxiety disorder: Secondary | ICD-10-CM

## 2011-09-13 ENCOUNTER — Encounter (HOSPITAL_COMMUNITY): Payer: Self-pay | Admitting: Behavioral Health
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
## 2011-09-19 ENCOUNTER — Telehealth (HOSPITAL_COMMUNITY): Payer: Self-pay

## 2011-09-26 ENCOUNTER — Ambulatory Visit (INDEPENDENT_AMBULATORY_CARE_PROVIDER_SITE_OTHER): Payer: BC Managed Care – PPO | Admitting: Behavioral Health

## 2011-09-26 ENCOUNTER — Encounter (HOSPITAL_COMMUNITY): Payer: Self-pay | Admitting: Behavioral Health

## 2011-09-26 DIAGNOSIS — F339 Major depressive disorder, recurrent, unspecified: Secondary | ICD-10-CM

## 2011-09-26 DIAGNOSIS — F411 Generalized anxiety disorder: Secondary | ICD-10-CM
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
## 2011-12-13 ENCOUNTER — Encounter (HOSPITAL_COMMUNITY): Payer: Self-pay | Admitting: Behavioral Health

## 2011-12-13 ENCOUNTER — Ambulatory Visit (INDEPENDENT_AMBULATORY_CARE_PROVIDER_SITE_OTHER): Payer: BC Managed Care – PPO | Admitting: Behavioral Health

## 2011-12-13 DIAGNOSIS — F068 Other specified mental disorders due to known physiological condition: Secondary | ICD-10-CM

## 2011-12-13 DIAGNOSIS — F331 Major depressive disorder, recurrent, moderate: Secondary | ICD-10-CM
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
## 2011-12-21 DIAGNOSIS — F329 Major depressive disorder, single episode, unspecified: Secondary | ICD-10-CM | POA: Insufficient documentation

## 2011-12-28 ENCOUNTER — Ambulatory Visit (HOSPITAL_COMMUNITY): Payer: Self-pay | Admitting: Behavioral Health

## 2011-12-28 ENCOUNTER — Ambulatory Visit (INDEPENDENT_AMBULATORY_CARE_PROVIDER_SITE_OTHER): Payer: BC Managed Care – PPO | Admitting: Behavioral Health

## 2011-12-28 ENCOUNTER — Encounter (HOSPITAL_COMMUNITY): Payer: Self-pay | Admitting: Behavioral Health

## 2011-12-28 DIAGNOSIS — F430 Acute stress reaction: Secondary | ICD-10-CM

## 2011-12-28 DIAGNOSIS — R4589 Other symptoms and signs involving emotional state: Secondary | ICD-10-CM
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
## 2012-01-03 ENCOUNTER — Ambulatory Visit (HOSPITAL_COMMUNITY): Payer: Self-pay | Admitting: Behavioral Health

## 2012-01-05 ENCOUNTER — Ambulatory Visit (INDEPENDENT_AMBULATORY_CARE_PROVIDER_SITE_OTHER): Payer: BC Managed Care – PPO | Admitting: Psychiatry

## 2012-01-05 ENCOUNTER — Encounter (HOSPITAL_COMMUNITY): Payer: Self-pay | Admitting: Psychiatry

## 2012-01-05 VITALS — BP 187/123 | HR 74 | Ht 66.0 in | Wt 352.0 lb

## 2012-01-05 DIAGNOSIS — F419 Anxiety disorder, unspecified: Secondary | ICD-10-CM

## 2012-01-05 DIAGNOSIS — F332 Major depressive disorder, recurrent severe without psychotic features: Secondary | ICD-10-CM

## 2012-01-05 DIAGNOSIS — F339 Major depressive disorder, recurrent, unspecified: Secondary | ICD-10-CM

## 2012-01-05 DIAGNOSIS — F333 Major depressive disorder, recurrent, severe with psychotic symptoms: Secondary | ICD-10-CM

## 2012-01-05 MED ORDER — CLONAZEPAM 0.5 MG PO TABS: 0.5000 mg | ORAL_TABLET | Freq: Four times a day (QID) | ORAL | Status: AC | PRN

## 2012-01-05 MED ORDER — VENLAFAXINE HCL ER 150 MG PO CP24: 300.0000 mg | ORAL_CAPSULE | Freq: Every day | ORAL | Status: DC
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
## 2012-01-23 ENCOUNTER — Ambulatory Visit (INDEPENDENT_AMBULATORY_CARE_PROVIDER_SITE_OTHER): Payer: 59 | Admitting: Behavioral Health

## 2012-01-23 DIAGNOSIS — F411 Generalized anxiety disorder: Secondary | ICD-10-CM

## 2012-01-23 DIAGNOSIS — F332 Major depressive disorder, recurrent severe without psychotic features: Secondary | ICD-10-CM

## 2012-01-24 ENCOUNTER — Encounter (HOSPITAL_COMMUNITY): Payer: Self-pay | Admitting: Behavioral Health
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
## 2012-02-02 ENCOUNTER — Ambulatory Visit (HOSPITAL_COMMUNITY): Payer: Self-pay | Admitting: Psychiatry

## 2012-02-08 ENCOUNTER — Ambulatory Visit (HOSPITAL_COMMUNITY): Payer: Self-pay | Admitting: Behavioral Health

## 2012-02-08 ENCOUNTER — Telehealth (HOSPITAL_COMMUNITY): Payer: Self-pay | Admitting: Behavioral Health

## 2012-02-08 NOTE — Telephone Encounter (Signed)
The client missed a 9:00 appointment this morning. She indicated that she was unaware that she had the appointment and that she was told it was scheduled back in August. She indicated that she did not get a phone call reminder. The office staff indicated that automated voicemail message is reminder supplemented go out. Asked the client to reschedule the appointment. I did not mark it as a no show.

## 2012-02-15 ENCOUNTER — Ambulatory Visit (INDEPENDENT_AMBULATORY_CARE_PROVIDER_SITE_OTHER): Payer: 59 | Admitting: Behavioral Health

## 2012-02-15 ENCOUNTER — Encounter (HOSPITAL_COMMUNITY): Payer: Self-pay | Admitting: Behavioral Health

## 2012-02-15 DIAGNOSIS — F411 Generalized anxiety disorder: Secondary | ICD-10-CM

## 2012-02-15 DIAGNOSIS — F331 Major depressive disorder, recurrent, moderate: Secondary | ICD-10-CM
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
## 2012-02-22 ENCOUNTER — Telehealth: Payer: Self-pay | Admitting: *Deleted

## 2012-02-22 ENCOUNTER — Ambulatory Visit (INDEPENDENT_AMBULATORY_CARE_PROVIDER_SITE_OTHER): Payer: BC Managed Care – PPO | Admitting: Obstetrics & Gynecology

## 2012-02-22 ENCOUNTER — Encounter: Payer: Self-pay | Admitting: Obstetrics & Gynecology

## 2012-02-22 VITALS — BP 168/112 | HR 91 | Temp 97.5°F | Resp 17 | Ht 67.0 in | Wt 338.0 lb

## 2012-02-22 DIAGNOSIS — N766 Ulceration of vulva: Secondary | ICD-10-CM

## 2012-02-22 DIAGNOSIS — B001 Herpesviral vesicular dermatitis: Secondary | ICD-10-CM | POA: Insufficient documentation

## 2012-02-22 DIAGNOSIS — N926 Irregular menstruation, unspecified: Secondary | ICD-10-CM

## 2012-02-22 MED ORDER — FLUCONAZOLE 150 MG PO TABS: ORAL_TABLET | ORAL | Status: DC

## 2012-02-22 NOTE — Progress Notes (Signed)
  Subjective:    Patient ID: Suzanne Crawford, female    DOB: 1975/06/21, 36 y.o.   MRN: [id number]  HPI  Pt presents c/o several days of vaginal discharge.  Pt was prescribed diflucan 150 mg q 3 days for 2 doses by PCP.  Pt tried vagisil but symptoms worsened.  Pt noticed ulcerations shortly after trying vagisil.  Pt experiences dysuria when urine hits the ulcers.  PT has discharge and vaginal discomfort.  Pt is in a monogamous relationship.  Pt denies fever, pelvic pain, vaginal bleeding, N/V/D.     Review of Systems    As above Objective:   Physical Exam  Vitals reviewed. Constitutional: She is oriented to person, place, and time. She appears well-developed and well-nourished.  HENT:  Head: Normocephalic and atraumatic.  Mouth/Throat: No oropharyngeal exudate.  Pulmonary/Chest: Effort normal.  Genitourinary:       Neurological: She is alert and oriented to person, place, and time.  Skin: Skin is warm and dry.  Psychiatric: She has a normal mood and affect.          Assessment & Plan:  36 year old female with vagianl discharge and vulvar ulcerations  1-UPT (pt amenorrheic from ablation) 2-HSV PCR 3-BD Assure 4-GC/Chlamydia 5-Discussed test with pt and will call with results 6-2 more tablets of Diflucan ordered 7-Vulvar hygiene 8-See PCP about BP 9-Congrats on weight loss and exercise! 10-RTC 2 weeks or sooner if needed

## 2012-02-22 NOTE — Addendum Note (Signed)
Addended by: Granville Lewis on: 02/22/2012 11:24 AM   Modules accepted: Orders

## 2012-02-22 NOTE — Telephone Encounter (Signed)
LM on voicemail that since her BP was elevated in the office today and she does take BP meds that she needed to f/u with her PCP which is [facility].

## 2012-02-23 ENCOUNTER — Encounter (HOSPITAL_COMMUNITY): Payer: Self-pay | Admitting: Behavioral Health

## 2012-02-23 ENCOUNTER — Ambulatory Visit (INDEPENDENT_AMBULATORY_CARE_PROVIDER_SITE_OTHER): Payer: 59 | Admitting: Behavioral Health

## 2012-02-23 DIAGNOSIS — F331 Major depressive disorder, recurrent, moderate: Secondary | ICD-10-CM

## 2012-02-23 DIAGNOSIS — F411 Generalized anxiety disorder: Secondary | ICD-10-CM

## 2012-02-23 LAB — WET PREP BY MOLECULAR PROBE: Candida species: NEGATIVE

## 2012-02-23 LAB — GC/CHLAMYDIA PROBE AMP, GENITAL: Chlamydia, DNA Probe: NEGATIVE
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
## 2012-02-27 ENCOUNTER — Encounter (HOSPITAL_COMMUNITY): Payer: Self-pay | Admitting: Behavioral Health

## 2012-02-28 ENCOUNTER — Telehealth: Payer: Self-pay | Admitting: *Deleted

## 2012-02-28 ENCOUNTER — Encounter (HOSPITAL_COMMUNITY): Payer: Self-pay | Admitting: Behavioral Health

## 2012-02-28 NOTE — Telephone Encounter (Signed)
Pt notified of HSV results and she is going to keep her appt on 03/14/12 to discuss with Dr leggett.

## 2012-03-01 ENCOUNTER — Encounter (HOSPITAL_COMMUNITY): Payer: Self-pay | Admitting: Behavioral Health

## 2012-03-08 ENCOUNTER — Ambulatory Visit (HOSPITAL_COMMUNITY): Payer: Self-pay | Admitting: Behavioral Health

## 2012-03-14 ENCOUNTER — Ambulatory Visit (INDEPENDENT_AMBULATORY_CARE_PROVIDER_SITE_OTHER): Payer: BC Managed Care – PPO | Admitting: Obstetrics & Gynecology

## 2012-03-14 ENCOUNTER — Encounter: Payer: Self-pay | Admitting: Obstetrics & Gynecology

## 2012-03-14 VITALS — BP 176/102 | HR 88 | Temp 97.1°F | Resp 17 | Ht 67.0 in | Wt 342.0 lb

## 2012-03-14 DIAGNOSIS — N949 Unspecified condition associated with female genital organs and menstrual cycle: Secondary | ICD-10-CM

## 2012-03-14 DIAGNOSIS — B009 Herpesviral infection, unspecified: Secondary | ICD-10-CM

## 2012-03-14 DIAGNOSIS — B001 Herpesviral vesicular dermatitis: Secondary | ICD-10-CM

## 2012-03-14 LAB — POCT URINE PREGNANCY: Preg Test, Ur: NEGATIVE

## 2012-03-14 MED ORDER — VALACYCLOVIR HCL 500 MG PO TABS: 500.0000 mg | ORAL_TABLET | Freq: Every day | ORAL | Status: AC

## 2012-03-14 NOTE — Progress Notes (Signed)
  Subjective:    Patient ID: Suzanne Crawford, female    DOB: May 02, 1976, 36 y.o.   MRN: [id number]  HPI  Pt presents to talk about HSV diagnosis.  Pt had a breakout in 1997.  Her partner at that time is most likely a carrier as his ex wife had herpes.  Pt would like to go on daily suppression.  Pt also is not using birth control.  She had fertility issues with her last pregnancy and thinks she is "safe".  t agrees to start using condoms.  If she desires further protection then Mirena IUD would be a good option for her.  Pt skipped menses last month and this month is very heavy.  Getting UPT  Review of Systems     Objective:   Physical Exam  SP = 176/102--needs to follow up with primary care MD.  Not having any symptoms of high BP currently.      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

## 2012-06-18 ENCOUNTER — Encounter (HOSPITAL_COMMUNITY): Payer: Self-pay | Admitting: Behavioral Health

## 2012-06-18 ENCOUNTER — Ambulatory Visit (INDEPENDENT_AMBULATORY_CARE_PROVIDER_SITE_OTHER): Payer: BC Managed Care – PPO | Admitting: Behavioral Health

## 2012-06-18 DIAGNOSIS — F331 Major depressive disorder, recurrent, moderate: Secondary | ICD-10-CM

## 2012-06-18 DIAGNOSIS — F411 Generalized anxiety disorder: Secondary | ICD-10-CM

## 2012-06-18 NOTE — Progress Notes (Signed)
   THERAPIST PROGRESS NOTE  Session Time: 11:00  Participation Level: Active  Behavioral Response: CasualAlertAnxious  Type of Therapy: Individual Therapy  Treatment Goals addressed: Coping  Interventions: CBT  Summary: Suzanne Crawford is a 37 y.o. female who presents with anxiety and depression.   Suicidal/Homicidal: Nowithout intent/plan  Therapist Response: This was the first time I have seen the clients in the middle of October. Her medicaid had relapsed but now been reinstated. She indicated that her health issues have become more difficult to deal with. She stated that she was in Major. depression throughout most of the holidays but has not found some difficulty skills in addition to some different medications which of lifted depression significantly. She indicated that health issues have contributed to that. She indicated that she went to the emergency room on the receiving have to stay overnight. She states that now fluid has built up around her heart and her lungs she's gained over 30 pounds in the last 4-6 weeks mainly due to fluid. She indicated that her medical Dr. has her own multiple Lasix to get the fluid down but she is only urinating about 16 ounces of urine per day . She indicated that she would went to a pain clinic as referred to by her medical doctor but that did not seem to help. She indicates that they have been on a test of which he will be going to the next 2 weeks to see if they can figure out why there is fluid retention. She stated that her doctors afraid that she will have a stroke because of her weight and because of the fluid gain in the fluid around her heart. She has done a better job of setting boundaries with people who are not family and she cannot devote time to. She indicates that her husband has been out of work for hernia surgery since October and will not go back for 2 weeks therefore there have been financial strains. She did indicate that on a positive note  she has begun to do the extreme couponing which is how she has paid for groceries for the past few months as well as found ways to buy stuff minimally as well as buy in bulk.. She indicates that his Her mind and her hands busy and also provide income for the family she states she is also gotten her children and often help her go to the store to do the couponing as well as some feels that she found on line. She indicates that her kids behavior has improved her grades have gone up since a fall had something to focus on. She does indicate that she is limited as to how much she can walk because she gets dizzy easily but that she is allowed to walk around the [employer] store spent much ride a part of her motorized cart in and other stores. We talked about other coping skills and she could be using for relaxation. She does indicate that she contract for safety saying she has no thoughts of hurting herself or anyone else and has no homicidal ideation. Plan: Return again in 3 weeks.  Diagnosis: Axis I: 296.32/300.02    Axis II: Deferred    [name], [facility] 06/18/2012

## 2012-07-11 ENCOUNTER — Encounter (HOSPITAL_COMMUNITY): Payer: Self-pay | Admitting: Behavioral Health

## 2012-07-11 ENCOUNTER — Ambulatory Visit (INDEPENDENT_AMBULATORY_CARE_PROVIDER_SITE_OTHER): Payer: BC Managed Care – PPO | Admitting: Behavioral Health

## 2012-07-11 DIAGNOSIS — F339 Major depressive disorder, recurrent, unspecified: Secondary | ICD-10-CM

## 2012-07-11 NOTE — Progress Notes (Signed)
   THERAPIST PROGRESS NOTE  Session Time: 11:00  Participation Level: Active  Behavioral Response: CasualAlertDepressed  Type of Therapy: Individual Therapy  Treatment Goals addressed: Coping  Interventions: CBT  Summary: Suzanne Crawford is a 37 y.o. female who presents with depression.   Suicidal/Homicidal: Nowithout intent/plan  Therapist Response: The client indicated that since I had seen her last time she had been referred by her medical Dr. to a nephrologist. She indicated that he did rule out that there is fluid around the heart and kidneys but feels that the high blood pressure is contributing to her fluid retention. She indicates she has gained 30 pounds over the past few months. The metabolic just added another blood pressure medication to her diet and has restricted her from salts sugars breads et [employer]. She indicates that has been difficult but knows that she needs to make changes saying that nephrologist told her that if that did not make some Rhetta Mura changes that she could be" gone" by the end of this year. He indicates that her husband and children have been helping out somewhat more around the house but that her physical health symptoms have increased. She reports that at times she cannot feel her hands, is often dizzy, and at times vision does not feel like he should be. She indicates that her team of doctors is aware of this. She is awaiting blood work from the nephrologist and will meet with her medical Dr. and/or nephrologist know the results. She did indicate this is giving way to increased depression although she does contract for safety saying that she has no thoughts of hurting herself or others. She did say that her husband has stepped up to the plate is helping much more around the house in terms of taking care of the house, the children, and meals. She indicates that she will have to be the one that does most of the meal preparation for herself so as order to  minimize her salt intake. She indicates that the nephrologist recommended that she get 1800 mg or less of saw per day and she signed that to be a challenge. She indicates that she feels that all the time is always exhausted and is not sleeping well. We reviewed coping skills for help with stress and anxiety encouraging her to continue to set limits with those things which do not meet her immediate attention with a someone else can handle. She indicated that she has done a much better job well but she has some limits to continue to set. She said that her 21 and 65-year-old daughters are giving him some problems in terms of behavior with the oldest of the 2 daughters being oppositional and the youngest is being reportedly hyperactive. I suggested that she speak to the youngest daughter's teacher about possibly screening for ADHD. The client again does contract for safety saying even though she is overwhelmed health issues she has no thoughts of hurting himself knowing that she has family to be responsible for. I will meet with her again in 2 weeks  Plan: Return again in 2 weeks.  Diagnosis: Axis I: 296.3    Axis II: Deferred    [name], [facility] 07/11/2012

## 2012-07-17 ENCOUNTER — Telehealth (HOSPITAL_COMMUNITY): Payer: Self-pay

## 2012-07-18 ENCOUNTER — Encounter (HOSPITAL_COMMUNITY): Payer: Self-pay | Admitting: Behavioral Health

## 2012-07-18 ENCOUNTER — Telehealth (HOSPITAL_COMMUNITY): Payer: Self-pay | Admitting: Behavioral Health

## 2012-07-18 NOTE — Telephone Encounter (Signed)
The client called indicating that there is a hearing for her daughter who has missed a significant amount of school. The client indicates she has not been charged but that she is requesting a letter stating that she see me for therapy and how some family issues are contributing to the clients need for therapy. She requested that the letter available by March 19 at 11:00 which is her next appointment with me. I told her that I would have a letter completed by them.

## 2012-07-24 ENCOUNTER — Encounter (HOSPITAL_COMMUNITY): Payer: Self-pay | Admitting: Behavioral Health

## 2012-07-25 ENCOUNTER — Ambulatory Visit (INDEPENDENT_AMBULATORY_CARE_PROVIDER_SITE_OTHER): Payer: BC Managed Care – PPO | Admitting: Behavioral Health

## 2012-07-25 DIAGNOSIS — F331 Major depressive disorder, recurrent, moderate: Secondary | ICD-10-CM

## 2012-07-26 ENCOUNTER — Encounter (HOSPITAL_COMMUNITY): Payer: Self-pay | Admitting: Behavioral Health

## 2012-07-26 NOTE — Progress Notes (Signed)
   THERAPIST PROGRESS NOTE  Session Time: 11:00  Participation Level: Active  Behavioral Response: CasualAlertDepressed  Type of Therapy: Individual Therapy  Treatment Goals addressed: Coping  Interventions: CBT  Summary: Suzanne Crawford is a 37 y.o. female who presents with depression.   Suicidal/Homicidal: Nowithout intent/plan  Therapist Response: The client continues to struggle with physical health issues. She indicates her blood pressure continues to be high and that her doctors are working to find ways to alleviate that. She did say that she has lost some weight and that she is doing better in terms of eating cutting back substantially on sodium intake. She indicated that most of her for her currently coming from family situations. She indicates her daughter [name] has made significant amounts of time in school. She stated that the school had sent to letter to neither which client got in because her mother received wanted her husband or she wanted neither showed the letter to her. She indicated that her daughter did not give her the paperwork that she should have and there is a meeting this afternoon to see what school will do with the clients daughter's situation. She indicated that the daughter is becoming more and more oppositional and that the youngest daughter is starting to mimic [name]'s behavior. She indicated that her husband is not helping any because he has decided that he Wants to Dr., Charmian [place] to take one of his coworkers to work at night and is gone for a couple of hours at a time where they need to be helping the children with homework and getting ready for bed. She even stated that he offered to take the daughter with him which would mean a look at home at 10:30 and they should be in bed. I told her and no uncertain terms that the husband to set up and stay at home at night and allow his coworkers to find his own way to work and make his family first. I told her she needed to  set a very firm and healthy boundary in terms of that because she did not need any other responsibilities at this point. The client is regular depression of 6 or 7/10 and some anxiety certainly related to family and health issues. She does contract for safety saying she has no thoughts of hurting herself or anyone else.  Plan: Return again in 3 weeks.  Diagnosis: Axis I: 296.32    Axis II: Deferred    [name], [facility] 07/26/2012

## 2012-08-27 ENCOUNTER — Ambulatory Visit (HOSPITAL_COMMUNITY): Payer: Self-pay | Admitting: Behavioral Health

## 2012-08-27 ENCOUNTER — Encounter (HOSPITAL_COMMUNITY): Payer: Self-pay

## 2012-08-28 ENCOUNTER — Ambulatory Visit (INDEPENDENT_AMBULATORY_CARE_PROVIDER_SITE_OTHER): Payer: BC Managed Care – PPO | Admitting: Behavioral Health

## 2012-08-28 ENCOUNTER — Encounter (HOSPITAL_COMMUNITY): Payer: Self-pay | Admitting: Behavioral Health

## 2012-08-28 DIAGNOSIS — F331 Major depressive disorder, recurrent, moderate: Secondary | ICD-10-CM

## 2012-08-28 NOTE — Progress Notes (Signed)
   THERAPIST PROGRESS NOTE  Session Time: 11:00  Participation Level: Active  Behavioral Response: CasualAlertDepressed  Type of Therapy: Individual Therapy  Treatment Goals addressed: Coping  Interventions: CBT  Summary: Suzanne Crawford is a 37 y.o. female who presents with depression.   Suicidal/Homicidal: Nowithout intent/plan  Therapist Response: The client started the session on a positive note saying that she is eating better, consuming less salt, has lost 17 pounds and her blood pressure is down if note is still high. She said that she had been in significant pain over the past 4 or 5 days as well as running a fever of 103 04. She stated that she had gone to her doctor and they found a large cyst on her ovary which she says has been an issue before they think it may be contributing to an infection which driving or fever and pain up. She was referred to go to [facility] in [place] today because [facility] is out of town this week and the could not get her in until next week. She promised that she would do that. She was clearly in significant pain without difficulty getting up and getting down and grimaced multiple times throughout the session. She indicates that her biggest stressor now is her husband's behavior. She indicates that he is more committed to a coworker in getting him to work them back and helping him out that he is taking care of her or their children. She suspects that the husband may be having an affair with this female coworker and said that she had a lengthy heated discussion with him saying that if she had any proof that all that this could be taking place that he would move out and legal action will be taken against him. She indicates she is not sure if he understands that her take her seriously but that she will not tolerate his what she sees as basically abandoning her and the kids when she needs him the most. We did talk at length about ways for her to cope with the  physical pain and also deal with the emotional aspects of raising a family, major health issues, and a husband who is not supportive. We also talked about coping skills for dealing with depression, stress, and anxiety. She does commit to safety saying she has no thoughts of hurting herself or anyone else focusing on taking care of her health and taking care of her children. Reports that her mother and the neighbor have been very supportive because she has not been able to drive in the past due to the pain she is in.  Plan: Return again in 2 weeks.  Diagnosis: Axis I: 296.32    Axis II: Deferred    [name], [facility] 08/28/2012

## 2012-09-04 ENCOUNTER — Ambulatory Visit: Payer: Self-pay | Admitting: Obstetrics & Gynecology

## 2012-09-20 ENCOUNTER — Ambulatory Visit (INDEPENDENT_AMBULATORY_CARE_PROVIDER_SITE_OTHER): Payer: BC Managed Care – PPO | Admitting: Behavioral Health

## 2012-09-20 DIAGNOSIS — F332 Major depressive disorder, recurrent severe without psychotic features: Secondary | ICD-10-CM

## 2012-09-21 ENCOUNTER — Encounter (HOSPITAL_COMMUNITY): Payer: Self-pay | Admitting: Behavioral Health
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
## 2012-10-19 ENCOUNTER — Encounter (HOSPITAL_COMMUNITY): Payer: Self-pay | Admitting: Behavioral Health

## 2012-10-19 ENCOUNTER — Ambulatory Visit (INDEPENDENT_AMBULATORY_CARE_PROVIDER_SITE_OTHER): Payer: BC Managed Care – PPO | Admitting: Behavioral Health

## 2012-10-19 DIAGNOSIS — F331 Major depressive disorder, recurrent, moderate: Secondary | ICD-10-CM

## 2012-10-19 NOTE — Progress Notes (Signed)
   THERAPIST PROGRESS NOTE  Session Time: 11:00  Participation Level: Active  Behavioral Response: CasualAlertDepressed  Type of Therapy: Individual Therapy  Treatment Goals addressed: Coping  Interventions: CBT  Summary: Suzanne Crawford is a 37 y.o. female who presents with depression.   Suicidal/Homicidal: Nowithout intent/plan  Therapist Response: The client stated that she has very little hearing. Her medical Dr. indicated that the infection probably returned and was referred to a specialist who she will see soon. She reports multiple other physical health issues which are contribute to her depression and anxiety. She reports significant financial issues. She does indicate that there hearing on July 1 for disability and she is hopeful that she will be approved for long-term disability. We talked at length about coping skills for reduction of anxiety and depression. We talked about setting healthy limits with both friends and family. She appears to have done a better job of that but I encouraged her in several other relationships in her life to set firmer boundaries. She indicates that her husband is not helpful saying that the only thing she asked him to do around the house his laundry and he is not getting that done. She reports that he is not active in helping care for the kids spending most of his time at work, working out or with friends. The client does contract for safety saying she has no thoughts of hurting herself or anyone else. She is committed to taking care of her children. She does have some supports although they are limited. Her father has health issues, her mother has health issues and her best friend has health issues. She indicates that she feels she has had mental boundaries knowing that there is always so much she can do to help them when she has enough other own issues to deal with.   Plan: Return again in 4 weeks.  Diagnosis: Axis I: 296.32    Axis II:  Deferred    [name], [facility] 10/19/2012

## 2012-11-02 ENCOUNTER — Ambulatory Visit (INDEPENDENT_AMBULATORY_CARE_PROVIDER_SITE_OTHER): Payer: BC Managed Care – PPO | Admitting: Behavioral Health

## 2012-11-02 ENCOUNTER — Encounter (HOSPITAL_COMMUNITY): Payer: Self-pay | Admitting: Behavioral Health

## 2012-11-02 DIAGNOSIS — F331 Major depressive disorder, recurrent, moderate: Secondary | ICD-10-CM

## 2012-11-02 NOTE — Progress Notes (Signed)
   THERAPIST PROGRESS NOTE  Session Time: 11:00  Participation Level: Active  Behavioral Response: CasualAlertDepressed  Type of Therapy: Individual Therapy  Treatment Goals addressed: Coping  Interventions: CBT  Summary: Matasha Smigelski is a 37 y.o. female who presents with depression.   Suicidal/Homicidal: Nowithout intent/plan  Therapist Response: The client presented today with multiple stressors including health issues. She has significant fluid buildup in her ears which has led to a significant reduction in hearing. That will need to be surgically addressed which the client cannot do that until she takes care of her son's health issues. She also is having issues with swelling in her feet which may be psoriatic arthritis. She continues to have lower spine issues and elevated blood pressure. She indicated that within a 3 day period her mother became sick, her father passed out and her sons stepped on a piece of glass which required emergency surgery. He also is awaiting possible surgery on his shoulder. She indicated that her husband has been minimally helpful. She indicated that she did become extremely frustrated with him and set very firm limits in terms of her expectations of him. She indicated that since that time he has become much more involved and helpful over a 3 or 4 day period. The client is attempting to set limits for herself states that she is the only one who can help with her mother and father. She also indicates that her son cannot put any weight on his foot and because of his shoulder cannot use crutches so she has to help him every time he gets up to do something. The client is certainly struggling with moderate to severe depression at times as well as at least moderate anxiety. In spite of that she works hard to maintain a positive focus and attitude. She indicates that she is sleeping somewhat better. She indicates that her children have stepped up to be helpful. She uses  extreme couponing as a way of stress relief and anxiety relief. She indicated that her sister in [place] has stepped up to help financially as having some other friends and family members. She has a disability hearing on July 1 and based on the conversation with her attorney feels hopeful that she will be granted long-term disability. She does contract for safety saying she has no thoughts of hurting herself or anyone else.  Plan: Return again in 4 weeks.  Diagnosis: Axis I: 296.32    Axis II: Deferred    [name], [facility] 11/02/2012

## 2012-11-19 ENCOUNTER — Ambulatory Visit (INDEPENDENT_AMBULATORY_CARE_PROVIDER_SITE_OTHER): Payer: BC Managed Care – PPO | Admitting: Behavioral Health

## 2012-11-19 ENCOUNTER — Encounter (HOSPITAL_COMMUNITY): Payer: Self-pay | Admitting: Behavioral Health

## 2012-11-19 DIAGNOSIS — F331 Major depressive disorder, recurrent, moderate: Secondary | ICD-10-CM

## 2012-11-20 NOTE — Progress Notes (Signed)
   THERAPIST PROGRESS NOTE  Session Time: 2:00  Participation Level: Active  Behavioral Response: CasualAlertDepressed  Type of Therapy: Individual Therapy  Treatment Goals addressed: Coping  Interventions: CBT  Summary: Suzanne Crawford is a 37 y.o. female who presents with depression.   Suicidal/Homicidal: Nowithout intent/plan  Therapist Response: The client indicated that the past few weeks have been very difficult for her physically. She reported that on July 11 she had been referred to the hospital because of ongoing health issues. She indicates that she now is dealing with 3 bulging cervical discs, psoriatic take arthritis and has extreme toxic cellulitis in her left foot and possibly in her right foot. She indicated that they wanted to keep her in the hospital overnight or longer because of the cellulitis in its severity but she could not because she did not have anyone to help take care of her children. She was in extreme pain during today's session shifting often on the chair to find a comfortable position. She indicated that the pain in her back as well as the cellulitis is keeping her from sleeping well. She indicates that he generated by the cellulitis has been at times almost unbearable. She is going to the pain management doctor as well as her medical Dr. over the next 2 days and states that she does not feel any better tomorrow she will have to find someone to help with her children so she can be hospitalized to get the cellulitis treated. She indicated that her husband has been a little better in terms of not working as much but is still not being very supportive or helpful at home or with their children. The client is also dealing with health issues with her mother and father as well as her best friend. We practiced supportive therapy today and reviewed coping skills for dealing with stress, anxiety, and depression. The client does contract for safety saying she has no thoughts of  hurting herself or anyone else.   Plan: Return again in 4 weeks.  Diagnosis: Axis I: 296.32    Axis II: Deferred    [name], [facility] 11/20/2012

## 2012-11-21 ENCOUNTER — Ambulatory Visit (HOSPITAL_COMMUNITY): Payer: Self-pay | Admitting: Behavioral Health

## 2012-11-21 DIAGNOSIS — L03119 Cellulitis of unspecified part of limb: Secondary | ICD-10-CM | POA: Insufficient documentation

## 2012-12-10 ENCOUNTER — Ambulatory Visit (HOSPITAL_COMMUNITY): Payer: Self-pay | Admitting: Behavioral Health

## 2012-12-24 ENCOUNTER — Ambulatory Visit (INDEPENDENT_AMBULATORY_CARE_PROVIDER_SITE_OTHER): Payer: BC Managed Care – PPO | Admitting: Behavioral Health

## 2012-12-24 ENCOUNTER — Encounter (HOSPITAL_COMMUNITY): Payer: Self-pay | Admitting: Behavioral Health

## 2012-12-24 DIAGNOSIS — F331 Major depressive disorder, recurrent, moderate: Secondary | ICD-10-CM

## 2012-12-24 NOTE — Progress Notes (Signed)
   THERAPIST PROGRESS NOTE  Session Time: 11:00  Participation Level: Active  Behavioral Response: CasualAlertDepressed  Type of Therapy: Individual Therapy  Treatment Goals addressed: Coping  Interventions: CBT  Summary: Suzanne Crawford is a 37 y.o. female who presents with depression.   Suicidal/Homicidal: Nowithout intent/plan  Therapist Response: The client reports with moderate to severe depression. She does contract for safety but indicates that multiple life events had become overwhelming. The clients mother had a series of seizures and TIAs and was intensive care for 3 or 4 days. Her mother is now at home because mother refused to go to rehabilitation but still has some memory loss. The client is very little help in taking care of her mother. Client has reported health issues. She had to be hospitalized for sepsis and left the hospital early because her mother was discharged from the hospital. The client reports major financial stressors. She indicates her husband is self absorbed and is not helping the family in any way. The client did say to her sepsis is getting better since she was getting fluids and is taking medication but states that she now has to keep up with her medication, her mother's medication, and her father's medication. The client has difficulty walking indicates that she is only sleeping on average 2-3 hours per night because of stress, depression, and physical pain. She did say that friends and family are helping out as much as possible with her children but that they'll have other things that they are dealing with also.  We practiced supportive therapy and talked about how she could take care of herself mentally, physically, and emotionally, the client does contract for safety saying she has no thoughts of hurting herself or anyone else. She did indicate that she is checking in with her doctors regularly but will not hesitate to go to the hospital if she feels physically  that she has become overwhelmed. I will see her again between 1 and 2 weeks and told her to call if she felt she needed to come in sooner and I would work her in.  Plan: Return again in 2 weeks.  Diagnosis: Axis I: 296.32    Axis II: Deferred    [name], [facility] 12/24/2012

## 2012-12-31 ENCOUNTER — Ambulatory Visit (INDEPENDENT_AMBULATORY_CARE_PROVIDER_SITE_OTHER): Payer: BC Managed Care – PPO | Admitting: Behavioral Health

## 2012-12-31 DIAGNOSIS — F331 Major depressive disorder, recurrent, moderate: Secondary | ICD-10-CM

## 2012-12-31 DIAGNOSIS — F411 Generalized anxiety disorder: Secondary | ICD-10-CM

## 2013-01-01 ENCOUNTER — Encounter (HOSPITAL_COMMUNITY): Payer: Self-pay | Admitting: Behavioral Health

## 2013-01-01 NOTE — Progress Notes (Signed)
   THERAPIST PROGRESS NOTE  Session Time: 4:00  Participation Level: Active  Behavioral Response: CasualAlertDepressed  Type of Therapy: Individual Therapy  Treatment Goals addressed: Coping  Interventions: CBT/supportive therapy  Summary: Suzanne Crawford is a 37 y.o. female who presents with depression and anxiety.   Suicidal/Homicidal: Nowithout intent/plan  Therapist Response: The client presents with multiple stressors. Her leg is better that she continues to have leg pain as well as difficulty with hearing. She indicates now that she has a cracked tooth as well as wisdom teeth which have begin to have a negative effect. The clients mother is still recovering from a stroke her father has health issues. Her husband is working but it is only minimally helping with the household and with the children. She reports continued financial issues. The client feels fairly confident based on the last conversation with her attorney that she will receive disability but is unsure when. She was told that she should hear something within 30 days and that has now been about 2 weeks. The client had a family member recently and one of her best friends is having serious health issues.  The client does rate her depression and anxiety is about 6 or 7 on a scale of 10. She indicates that she is not sleeping well saying that just doing the minimum amount around the house keeping a cleaning keeping up with her children and their schoolwork we is very little time for sleep. She also has to monitor her mother's and father's medication as well as her mother's diet. The client does contract for safety think she has no thoughts of hurting herself but saying she is exhausted and in  physical discomfort all the time.  Plan: Return again in 2 weeks.  Diagnosis: Axis I: 296.32/300.02    Axis II: Deferred    [name], [facility] 01/01/2013

## 2013-01-09 ENCOUNTER — Ambulatory Visit (INDEPENDENT_AMBULATORY_CARE_PROVIDER_SITE_OTHER): Payer: BC Managed Care – PPO | Admitting: Behavioral Health

## 2013-01-09 ENCOUNTER — Encounter (HOSPITAL_COMMUNITY): Payer: Self-pay | Admitting: Behavioral Health

## 2013-01-09 DIAGNOSIS — F331 Major depressive disorder, recurrent, moderate: Secondary | ICD-10-CM

## 2013-01-09 NOTE — Progress Notes (Signed)
   THERAPIST PROGRESS NOTE  Session Time: 1:00  Participation Level: Active  Behavioral Response: CasualAlertDepressed/anxious  Type of Therapy: Individual Therapy  Treatment Goals addressed: Coping  Interventions: CBT  Summary: Suzanne Crawford is a 37 y.o. female who presents with depression/anxiety/family problems.   Suicidal/Homicidal: Nowithout intent/plan  Therapist Response: The client presents with multiple stressors. She indicates elevated blood pressure, continued swelling in her feet and legs,, and problems with her teeth. She is on additional blood pressure medication. She could not remember the name of that medication. She has multiple family stressors including a son who needs oral surgery, a son who needs to be referred to a psychiatrist. She is having difficulty finding a psychiatrist that she can get him to that does take Medicaid. She reports that her daughter is oppositional. She reports her husband is never at home. He was off work 5 days and then nothing around the house including when it was flooding from the washing machine into the basement. The client reports that she is sleeping no more than 2-3 hours per night. She reports being in constant pain in her legs and back as well as dizzy from elevated blood pressure. She reports anxiety and stress from her sister being in town criticizing the client for not doing enough to help their mother. She also states her mother had additional seizures. The client states that her mother is 260 pounds and she is having to lift her mother into the time. A CNA was offered to come help they've the mother but the mother refused. The client did have a home health nurse coming out to see her 2 days a week but due to scheduling conflicts the in home nurse will no longer see the patient. The client has not yet heard from disability but hopes to hear within the next 7-10 days. She indicates that she attempt to use coping skills but" pushes through"  because she has to and has very little help. She does contract for safety having no thoughts of hurting herself or anyone else. We practiced supportive therapy I reminded her of the importance of using coping skills.  Plan: Return again in 2 weeks.  Diagnosis: Axis I: 296.32    Axis II: Deferred    [name], [facility] 01/09/2013

## 2013-01-16 ENCOUNTER — Ambulatory Visit (HOSPITAL_COMMUNITY): Payer: Self-pay | Admitting: Behavioral Health

## 2013-01-30 ENCOUNTER — Encounter (HOSPITAL_COMMUNITY): Payer: Self-pay | Admitting: Behavioral Health

## 2013-02-15 ENCOUNTER — Encounter (HOSPITAL_COMMUNITY): Payer: Self-pay | Admitting: Behavioral Health

## 2013-02-15 ENCOUNTER — Ambulatory Visit (INDEPENDENT_AMBULATORY_CARE_PROVIDER_SITE_OTHER): Payer: BC Managed Care – PPO | Admitting: Behavioral Health

## 2013-02-15 DIAGNOSIS — F331 Major depressive disorder, recurrent, moderate: Secondary | ICD-10-CM

## 2013-02-15 NOTE — Progress Notes (Signed)
   THERAPIST PROGRESS NOTE  Session Time: 9:00  Participation Level: Active  Behavioral Response: CasualAlertDepressed  Type of Therapy: Individual Therapy  Treatment Goals addressed: Coping  Interventions: CBT  Summary: Jobina Maita is a 37 y.o. female who presents with depression.   Suicidal/Homicidal: Nowithout intent/plan  Therapist Response: The client presents today with multiple stressors. Her husband had a heart attack a few weeks ago and is recovering. Her father had a mild stroke and is still experiencing some speech difficulties related to that. Her mother had to be hospitalized again and continues to refuse home care as it is provided for her therefore the client or someone else has to be with her around the clock. The client to get some good news in that she was approved for asked as an a and SSI. She states that will help some and that she can pay off debts with the lump-sum payments. The monthly payment is not as much as she would've liked but indicates that pain offered that will put her in a place where she thinks she can make the monthly payment work. Her husband contractual he has to provide insurance and health care for she and their children.    The client continues to experience extremely high blood pressure as well as swelling in both of her feet and legs. She is going to a pain management doctor but indicates that her anxiety and depression still continue to be significant. She does contract for safety having no thoughts of hurting herself or anyone else. We've reviewed triggers for her stress as well as ways to alleviate that. We stressed the importance of the client taking care of herself. She still is not sleeping well but says she will speak to her doctor about medication for anxiety and to help with sleep.   Plan: Return again in 4 weeks.  Diagnosis: Axis I: 296.32    Axis II: Deferred    Breah Joa M, LPC 02/15/2013

## 2013-03-15 ENCOUNTER — Telehealth (HOSPITAL_COMMUNITY): Payer: Self-pay

## 2013-03-18 NOTE — Telephone Encounter (Signed)
I advised our office to schedule client from my next available appointment.

## 2013-03-22 ENCOUNTER — Ambulatory Visit (HOSPITAL_COMMUNITY): Payer: Self-pay | Admitting: Behavioral Health

## 2013-03-27 ENCOUNTER — Encounter (HOSPITAL_COMMUNITY): Payer: Self-pay | Admitting: Behavioral Health

## 2013-03-27 ENCOUNTER — Ambulatory Visit (INDEPENDENT_AMBULATORY_CARE_PROVIDER_SITE_OTHER): Payer: BC Managed Care – PPO | Admitting: Behavioral Health

## 2013-03-27 DIAGNOSIS — F332 Major depressive disorder, recurrent severe without psychotic features: Secondary | ICD-10-CM

## 2013-03-27 NOTE — Progress Notes (Signed)
   THERAPIST PROGRESS NOTE  Session Time: 11:00  Participation Level: Active  Behavioral Response: CasualAlertDepressed  Type of Therapy: Individual Therapy  Treatment Goals addressed: Coping  Interventions: CBT  Summary: Suzanne Crawford is a 37 y.o. female who presents with depression.   Suicidal/Homicidal: Nowithout intent/plan  Therapist Response: The client for porch with multiple stressors for depression. In a 2 through a period, her uncle was murdered, her and she was one of her best friends died after a massive heart attack, and her mother died of cancer. She indicated that her mother's health went downhill quickly and they found that her body and for the most part been taken over by cancer. She indicated that her mother was said to the hospice house was only there for a few days before passing away. The client was able to be with her mother for some closure but is clearly still grieving. She also indicates her mother's death has created some financial issues as well as family quarrels with her sister who lives in another state. We spent a lengthy amount of time talking about how the client can't take care of herself. I recommended grief counselor but the client wants to continue coming here so I will see her in 2 weeks which is when the next appointment was available and put her on a cancellation list for next week. She does contract for safety saying that although she is depressed she has no thoughts of hurting herself or anyone else. She indicates that she is focused on taking care of and protecting her children who are also dealing with the loss of her grandmother.  Plan: Return again in 2 weeks.  Diagnosis: Axis I: 296.33    Axis II: Deferred    [name], [facility] 03/27/2013

## 2013-04-11 ENCOUNTER — Ambulatory Visit (HOSPITAL_COMMUNITY): Payer: Self-pay | Admitting: Behavioral Health

## 2013-04-16 ENCOUNTER — Ambulatory Visit (INDEPENDENT_AMBULATORY_CARE_PROVIDER_SITE_OTHER): Payer: BC Managed Care – PPO | Admitting: Behavioral Health

## 2013-04-16 DIAGNOSIS — F331 Major depressive disorder, recurrent, moderate: Secondary | ICD-10-CM

## 2013-04-17 ENCOUNTER — Encounter (HOSPITAL_COMMUNITY): Payer: Self-pay | Admitting: Behavioral Health

## 2013-04-17 ENCOUNTER — Telehealth (HOSPITAL_COMMUNITY): Payer: Self-pay | Admitting: Behavioral Health

## 2013-04-17 NOTE — Telephone Encounter (Signed)
I left the patient a voicemail message indicating that I had her letter for jury duty ready at our front desk.

## 2013-04-17 NOTE — Progress Notes (Signed)
   THERAPIST PROGRESS NOTE  Session Time: 3:00  Participation Level: Active  Behavioral Response: CasualAlertDepressed/anxious  Type of Therapy: Individual Therapy  Treatment Goals addressed: Coping  Interventions: CBT  Summary: Suzanne Crawford is a 37 y.o. female who presents with anxiety and depression.   Suicidal/Homicidal: Nowithout intent/plan  Therapist Response: The patient with reports multiple stressors. She said that she was in the hospital file days over the past week or so with edema in her feet and legs. She indicated that it had gotten to the point where she could not tolerate the pain has difficulty standing. She is only sleeping a few hours per night. She indicates that she continues to grieve the loss of her mother and his become overwhelmed in attempting to handle her mother's affairs. She indicated that her mother had not paid several bills for several months and she is trying to catch up with that. She indicates her husband even though he is working is not contributing financially is doing very little to help around the house. She states that 2 of her children have been sick and had to miss school this week. We talked about others that she could reach out to for some help but it appears as if her best friends is in bed physical health and that one of her good friends has recently lost her job. She indicates that the church has helped some in terms of finances as in food. The client had been doing a lot of couponing indicates that her health in the past few weeks has limited the amount of time that she can do that. We did review coping skills for alleviating stress and anxiety as well as depression. She does contract for safety saying she does not think of hurting herself or anyone else.  Plan: Return again in 2 weeks.  Diagnosis: Axis I: 296.32    Axis II: Deferred    [name], [facility] 04/17/2013

## 2013-04-30 ENCOUNTER — Ambulatory Visit (INDEPENDENT_AMBULATORY_CARE_PROVIDER_SITE_OTHER): Payer: BC Managed Care – PPO | Admitting: Behavioral Health

## 2013-04-30 ENCOUNTER — Encounter (HOSPITAL_COMMUNITY): Payer: Self-pay | Admitting: Behavioral Health

## 2013-04-30 DIAGNOSIS — F331 Major depressive disorder, recurrent, moderate: Secondary | ICD-10-CM

## 2013-04-30 NOTE — Progress Notes (Signed)
   THERAPIST PROGRESS NOTE  Session Time: 8:00  Participation Level: Active  Behavioral Response: CasualAlertDepressed  Type of Therapy: Individual Therapy  Treatment Goals addressed: Coping  Interventions: CBT  Summary: Suzanne Crawford is a 37 y.o. female who presents with depression.   Suicidal/Homicidal: Nowithout intent/plan  Therapist Response: The client continues to struggle with moderate to severe depression. Part of the depression is connected to the holiday season as her mother died approximately one month ago and this will be the first Christmas without her mother. She also struggles with financial issues and indicates there is difficulty getting her children much for Christmas. She also indicates that she struggled with the flu for 2 weeks peer she was coughing and congested as we met. She also indicates that in part because of the hearing problem she is having the past as well as a flu she's having difficulty hearing.  The client even with all that is going on is attempting to help other families to have lessened she does. I applaud her for that saying I know it's good therapy for her but reminded her that she needs to set boundaries in terms of taking care of herself. She is also having difficulty sleeping saying that she sleeps on and off throughout the night and has to sleep in the recliner because of swelling in her ankles and feet and you to having dealt with a flu and ongoing congestion. She does contract for safety saying she has no thoughts of hurting herself or anyone else.  Plan: Return again in 2 weeks.  Diagnosis: Axis I: 296.32    Axis II: Deferred    [name], [facility] 04/30/2013

## 2013-05-13 ENCOUNTER — Ambulatory Visit (HOSPITAL_COMMUNITY): Payer: Self-pay | Admitting: Behavioral Health

## 2013-05-20 ENCOUNTER — Ambulatory Visit (HOSPITAL_COMMUNITY): Payer: Self-pay | Admitting: Behavioral Health

## 2013-05-22 ENCOUNTER — Ambulatory Visit (HOSPITAL_COMMUNITY): Payer: Self-pay | Admitting: Behavioral Health

## 2013-05-27 ENCOUNTER — Encounter (HOSPITAL_COMMUNITY): Payer: Self-pay | Admitting: Behavioral Health

## 2013-05-27 ENCOUNTER — Encounter (INDEPENDENT_AMBULATORY_CARE_PROVIDER_SITE_OTHER): Payer: Self-pay

## 2013-05-27 ENCOUNTER — Ambulatory Visit (INDEPENDENT_AMBULATORY_CARE_PROVIDER_SITE_OTHER): Payer: BC Managed Care – PPO | Admitting: Behavioral Health

## 2013-05-27 DIAGNOSIS — F331 Major depressive disorder, recurrent, moderate: Secondary | ICD-10-CM

## 2013-05-27 NOTE — Progress Notes (Signed)
   THERAPIST PROGRESS NOTE  Session Time: 11:00  Participation Level: Active  Behavioral Response: CasualAlertDepressed  Type of Therapy: Individual Therapy  Treatment Goals addressed: Coping  Interventions: CBT  Summary: Suzanne Crawford is a 38 y.o. female who presents with depression.   Suicidal/Homicidal: Nowithout intent/plan  Therapist Response: To presents with multiple stressors. After her previous hospitalization she was referred to any bariatric practice. She will meet with staff there on January 26. She indicated that an initial phone interview made her feel as if they may try weight loss reduction program as opposed to bariatric surgery initially. She indicates that the goal would be to lose somewhere between 20 and 40 pounds over a six-month period with an 800-calorie restriction. She does indicate some anxiety related to that but understands that with the weight she is now she's considered obese and that is contributing to her health issues. She continues to have fluid build up around her ankles and lower legs. She indicates that she cannot hear anything in her left ear and her medical Dr. feels that dear go may be completely blocked. She reports only partial hearing with her right ear. Her blood pressure continues to run high per her report.  She indicates that home situation has not changed. She is cleaning out her mother's things as well as a great aunts things and indicates that her husband is of minimal help. She reports that her youngest daughter brought home lice from school and she has had to deal with that with the daughter and other family members. She also indicated that a cousin who has 3 children is being investigated by DSS and that she would agree to take 3 children temporarily if they are taken from her cousin. I reminded her of our conversation about boundaries and her mother needed to take on anything else having 4 children of her own. She indicates that would only be  temporary until he can find another home for the children or another family member they could take him.  She does contract for safety saying she has no thoughts of hurting herself or anyone else.  Plan: Return again in 2 weeks.  Diagnosis: Axis I: 296.32    Axis II: Deferred    French AnaETERS,Donabelle Molden M, [facility] 05/27/2013

## 2013-06-07 DIAGNOSIS — G4733 Obstructive sleep apnea (adult) (pediatric): Secondary | ICD-10-CM | POA: Insufficient documentation

## 2013-06-10 ENCOUNTER — Ambulatory Visit (HOSPITAL_COMMUNITY): Payer: Self-pay | Admitting: Behavioral Health

## 2013-07-09 ENCOUNTER — Encounter (HOSPITAL_COMMUNITY): Payer: Self-pay | Admitting: Behavioral Health

## 2013-07-16 ENCOUNTER — Ambulatory Visit (INDEPENDENT_AMBULATORY_CARE_PROVIDER_SITE_OTHER): Payer: BC Managed Care – PPO | Admitting: Obstetrics & Gynecology

## 2013-07-16 ENCOUNTER — Encounter: Payer: Self-pay | Admitting: Obstetrics & Gynecology

## 2013-07-16 ENCOUNTER — Inpatient Hospital Stay (HOSPITAL_COMMUNITY): Admission: RE | Admit: 2013-07-16 | Payer: Self-pay | Source: Ambulatory Visit

## 2013-07-16 VITALS — BP 157/94 | HR 82 | Resp 17 | Ht 67.0 in | Wt 350.0 lb

## 2013-07-16 DIAGNOSIS — Z124 Encounter for screening for malignant neoplasm of cervix: Secondary | ICD-10-CM

## 2013-07-16 DIAGNOSIS — Z1151 Encounter for screening for human papillomavirus (HPV): Secondary | ICD-10-CM

## 2013-07-16 DIAGNOSIS — N926 Irregular menstruation, unspecified: Secondary | ICD-10-CM

## 2013-07-16 DIAGNOSIS — Z113 Encounter for screening for infections with a predominantly sexual mode of transmission: Secondary | ICD-10-CM

## 2013-07-16 DIAGNOSIS — Z01419 Encounter for gynecological examination (general) (routine) without abnormal findings: Secondary | ICD-10-CM

## 2013-07-16 LAB — POCT URINE PREGNANCY: Preg Test, Ur: NEGATIVE

## 2013-07-16 NOTE — Progress Notes (Signed)
  Subjective:     Suzanne Crawford is a 38 y.o. female here for a routine exam.  Current complaints: no menses since January.  Pt has been having menses every 30-45 days.  Pt has had recent weight loss which has regulated her menses more and increased her libido.  Pt is not on birth control  UPT is negative today.  t is to get Mirena ASAP.   Pt's mother and close cousin recently died in November.  Pt has still had outbreaks of weeping pustules on her legs and is under the care of MD for this.  Pt needs Pap (recent loss of insurance and she has not come to gynecologist).  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   Personal health questionnaire reviewed: yes.   Gynecologic History Patient's last menstrual period was 05/24/2013. Contraception: none Last Pap: 2011. Results were: normal  Obstetric History OB History  Gravida Para Term Preterm AB SAB TAB Ectopic Multiple Living  4 4 3 1      4     # Outcome Date GA Lbr Len/2nd Weight Sex Delivery Anes PTL Lv  4 PRE      SVD     3 TRM      SVD     2 TRM      CS        Comments: toxemia  1 TRM      SVD          The following portions of the patient's history were reviewed and updated as appropriate: allergies, current medications, past family history, past medical history, past social history, past surgical history and problem list.  Review of Systems Pertinent items are noted in HPI.    Objective:      Filed Vitals:   07/16/13 1028  BP: 157/94  Pulse: 82  Resp: 17  Height: 5\' 7"  (1.702 m)  Weight: 350 lb (158.759 kg)   Vitals:  WNL General appearance: alert, cooperative and no distress Head: Normocephalic, without obvious abnormality, atraumatic Eyes: negative Throat: lips, mucosa, and tongue normal; teeth and gums normal Lungs: clear to auscultation bilaterally Breasts: normal appearance, no masses or tenderness, No nipple retraction or dimpling, No nipple discharge or bleeding Heart: regular rate and rhythm Abdomen: soft,  non-tender; bowel sounds normal; no masses,  no organomegaly  Pelvic:  External Genitalia:  Tanner V, no lesion Urethra Vagina:  Pink, normal rugae, no blood or discharge Cervix:  No CMT, no lesion, bleeding with pap Uterus:  Normal but difficult to evaluate due to habitus. Adnexa:   Normal but difficult to evaluate due to habitus.  Extremities: edema, scabs, pustules Skin: no lesions or rash Lymph nodes: Axillary adenopathy: none        Assessment:    Healthy female exam.    Plan:    Education reviewed: self breast exams, skin cancer screening, weight bearing exercise and birth control. Contraception: IUD. Follow up in: 2 weeks.   Cultures off pap for testing prior to IUD. Abstinence or protected sex only for 2 weeks.  Rpt UPT at time of insertion

## 2013-07-17 LAB — CERVICOVAGINAL ANCILLARY ONLY
Chlamydia: NEGATIVE
Neisseria Gonorrhea: NEGATIVE

## 2013-07-30 ENCOUNTER — Ambulatory Visit: Payer: Self-pay | Admitting: Obstetrics & Gynecology

## 2013-08-28 DIAGNOSIS — E8881 Metabolic syndrome: Secondary | ICD-10-CM | POA: Insufficient documentation

## 2014-03-10 ENCOUNTER — Encounter: Payer: Self-pay | Admitting: Obstetrics & Gynecology

## 2014-06-03 DIAGNOSIS — I2699 Other pulmonary embolism without acute cor pulmonale: Secondary | ICD-10-CM | POA: Insufficient documentation

## 2014-06-24 DIAGNOSIS — Z7901 Long term (current) use of anticoagulants: Secondary | ICD-10-CM | POA: Insufficient documentation

## 2015-04-08 ENCOUNTER — Ambulatory Visit: Payer: Self-pay | Admitting: Obstetrics & Gynecology

## 2015-10-12 ENCOUNTER — Ambulatory Visit: Payer: Self-pay | Admitting: Obstetrics & Gynecology

## 2016-01-20 ENCOUNTER — Ambulatory Visit: Payer: Self-pay | Admitting: Obstetrics & Gynecology

## 2016-03-14 ENCOUNTER — Other Ambulatory Visit (INDEPENDENT_AMBULATORY_CARE_PROVIDER_SITE_OTHER): Payer: Medicare Other

## 2016-03-14 DIAGNOSIS — N912 Amenorrhea, unspecified: Secondary | ICD-10-CM

## 2016-03-14 DIAGNOSIS — Z3202 Encounter for pregnancy test, result negative: Secondary | ICD-10-CM | POA: Diagnosis not present

## 2016-03-14 LAB — POCT URINE PREGNANCY: PREG TEST UR: NEGATIVE

## 2016-03-14 NOTE — Progress Notes (Signed)
Pt walks into office wanting a pregnancy test.  She states that her last LMP was in Aug 2017.  States she did a home UPT and it was faintly positive.  UPT in office is neg but BHCG drawn.  Will call pt with results.

## 2016-03-15 ENCOUNTER — Telehealth: Payer: Self-pay | Admitting: *Deleted

## 2016-03-15 LAB — HCG, QUANTITATIVE, PREGNANCY: hCG, Beta Chain, Quant, S: <2 m[IU]/mL

## 2016-03-15 NOTE — Telephone Encounter (Signed)
Attempted to reach pt via phone but mailbox was full.  BHCG is neg

## 2016-07-05 DIAGNOSIS — D6851 Activated protein C resistance: Secondary | ICD-10-CM | POA: Insufficient documentation

## 2017-02-17 DIAGNOSIS — N182 Chronic kidney disease, stage 2 (mild): Secondary | ICD-10-CM | POA: Insufficient documentation

## 2018-03-01 DIAGNOSIS — R296 Repeated falls: Secondary | ICD-10-CM | POA: Insufficient documentation

## 2022-11-01 DIAGNOSIS — R569 Unspecified convulsions: Secondary | ICD-10-CM | POA: Insufficient documentation

## 2023-02-06 ENCOUNTER — Telehealth: Payer: Self-pay | Admitting: *Deleted

## 2023-02-06 NOTE — Telephone Encounter (Signed)
Left patient an urgent message to call and speak with Kisha to schedule.

## 2023-02-15 NOTE — Progress Notes (Deleted)
[place] from [facility]: Redemonstrated rounded right adnexal lesion without internal flow, nonspecific and may reflect hemorrhagic cyst or endometrioma. Consider follow-up ultrasound in approximately 12 weeks for further assessment. 2.4 cm cyst  No recent pap smear

## 2023-02-16 ENCOUNTER — Encounter: Payer: Medicare Other | Admitting: Obstetrics and Gynecology

## 2023-04-05 ENCOUNTER — Encounter: Payer: Medicare Other | Admitting: Obstetrics and Gynecology

## 2023-10-30 DIAGNOSIS — F444 Conversion disorder with motor symptom or deficit: Secondary | ICD-10-CM | POA: Insufficient documentation

## 2024-03-07 ENCOUNTER — Ambulatory Visit: Admitting: Obstetrics and Gynecology

## 2024-03-26 NOTE — Progress Notes (Signed)
 GYNECOLOGY OFFICE VISIT NOTE  History:  Suzanne Crawford is a 48 y.o. [id number] here today for peri-menopause.    Of specific note is her history of TIA which is an absolute C/I to HT. She also has a history of PE.   Discussed the use of AI scribe software for clinical note transcription with the patient, who gave verbal consent to proceed.  History of Present Illness Suzanne Crawford is a 48 year old female with factor V Leiden and transient ischemic attacks who presents with irregular periods and perimenopausal symptoms.  She has been experiencing irregular menstrual cycles since May 2025, with only two periods occurring since then. Additionally, she has been experiencing hot flashes and mood swings, which began around the same time. The hot flashes occur sporadically, sometimes several times a day, but not daily.  Her family has noted mood changes, describing her as 'really edgy,' although she feels like her usual self. She also reports a decreased libido, stating she 'just doesn't want to be touched,' and notes that she and her partner have not been intimate since August 2025. She is concerned about the impact of this on her marriage, especially since she was married in October 2024. Prior to this, she wanted to have intercourse regularly.   No pain with intercourse or vaginal dryness. She is the primary caregiver for her 36 year old father.   Past Medical History:  Diagnosis Date   Anxiety    Depression    Endometriosis    Factor 5 Leiden mutation, heterozygous    Hypertension    PCOS (polycystic ovarian syndrome)    TIA (transient ischemic attack)    Had 3 or 4. In May and Aug 2012, and twice this year.    Past Surgical History:  Procedure Laterality Date   CESAREAN SECTION  2000   CHOLECYSTECTOMY  2002   fibromyalgia     fibromyalgia     TYMPANOSTOMY TUBE PLACEMENT      The following portions of the patient's history were reviewed and updated as appropriate: allergies,  current medications, past family history, past medical history, past social history, past surgical history and problem list.   Health Maintenance:   No recent pap on file.  2015 - pap normal HPV negative.   No recent MXR on file.   Review of Systems:  Pertinent items noted in HPI and remainder of comprehensive ROS otherwise negative.  Physical Exam:  BP (!) 170/129   Pulse 86   Ht 5' 7 (1.702 m)   Wt 288 lb (130.6 kg)   LMP  (LMP Unknown) Comment: Has only had 2 since May  BMI 45.11 kg/m  CONSTITUTIONAL: Well-developed, well-nourished female in no acute distress.  HEENT:  Normocephalic, atraumatic. External right and left ear normal. No scleral icterus.  NECK: Normal range of motion, supple, no masses noted on observation SKIN: No rash noted. Not diaphoretic. No erythema. No pallor. MUSCULOSKELETAL: Normal range of motion. No edema noted. NEUROLOGIC: Alert and oriented to person, place, and time. Normal muscle tone coordination. No cranial nerve deficit noted. PSYCHIATRIC: Normal mood and affect. Normal behavior. Normal judgment and thought content.  PELVIC: Deferred  Labs and Imaging No results found for this or any previous visit (from the past week). No results found.  Assessment and Plan:  Suzanne Crawford was seen today for perimenopause.  Diagnoses and all orders for this visit:  Perimenopausal symptom Hormone therapy contraindicated due to increased risk of stroke, heart attack, and blood clots. Non-hormonal options considered. Paxil  approved for hot flashes and mood swings. Buspar preferred for decreased libido due to ease of dose adjustment and lack of interaction with Xanax. - Prescribed Paxil (Brisdelle) for hot flashes and mood swings. - Discussed potential insurance coverage issues for Paxil and alternative options. - Next line would be to consider Veozah. Would need CMP monitoring.  -     PARoxetine (PAXIL) 10 MG tablet; Take 1 tablet (10 mg total) by mouth  daily.  Low libido - Discussed treatment options: - Discussed buspar and wellbutrin.  - For supplements, may try Ashwaganda, maca root and zinc - Discussed easy start/stop of buspar. If not improved when BID, would change to Wellbutrin.  - Would consider sex therapist if refractory to wellbutrin given her other medical symptoms and being primary caregiver.  -     busPIRone (BUSPAR) 10 MG tablet; Take 1 tablet (10 mg total) by mouth 2 (two) times daily.     Meds ordered this encounter  Medications   busPIRone (BUSPAR) 10 MG tablet    Sig: Take 1 tablet (10 mg total) by mouth 2 (two) times daily.    Dispense:  60 tablet    Refill:  3   PARoxetine (PAXIL) 10 MG tablet    Sig: Take 1 tablet (10 mg total) by mouth daily.    Dispense:  90 tablet    Refill:  3     Routine preventative health maintenance measures emphasized. Please refer to After Visit Summary for other counseling recommendations.   Return for annual.  Vina Solian, MD, FACOG Obstetrician & Gynecologist, [facility] for [employer], [facility]

## 2024-03-27 ENCOUNTER — Ambulatory Visit: Admitting: Obstetrics and Gynecology

## 2024-03-27 ENCOUNTER — Encounter: Payer: Self-pay | Admitting: Obstetrics and Gynecology

## 2024-03-27 VITALS — BP 170/129 | HR 86 | Ht 67.0 in | Wt 288.0 lb

## 2024-03-27 DIAGNOSIS — R6882 Decreased libido: Secondary | ICD-10-CM

## 2024-03-27 DIAGNOSIS — N951 Menopausal and female climacteric states: Secondary | ICD-10-CM

## 2024-03-27 MED ORDER — BUSPIRONE HCL 10 MG PO TABS: 10.0000 mg | ORAL_TABLET | Freq: Two times a day (BID) | ORAL | 3 refills | Status: AC

## 2024-03-27 MED ORDER — PAROXETINE HCL 10 MG PO TABS: 10.0000 mg | ORAL_TABLET | Freq: Every day | ORAL | 3 refills | Status: AC

## 2024-05-14 NOTE — Progress Notes (Unsigned)
" ° °  ANNUAL EXAM Patient name: Lucille Witts MRN [id number]  Date of birth: 15-Aug-1975 Chief Complaint:   No chief complaint on file.  History of Present Illness:   Sarita Hakanson is a 49 y.o. [phone] female being seen today for a routine annual exam.   Current concerns: ***  Current birth control: ***  No LMP recorded.   Last MXR: ***  Gardasil: *** Last Pap/Pap History:  H/O abnormal pap: {yes/yes***/no:23866} ***  Review of Systems:   Pertinent items are noted in HPI Denies any headaches, blurred vision, fatigue, shortness of breath, chest pain, abdominal pain, abnormal vaginal discharge/itching/odor/irritation, problems with periods, bowel movements, urination, or intercourse unless otherwise stated above. *** Pertinent History Reviewed:  Reviewed past medical,surgical, social and family history.  Reviewed problem list, medications and allergies. Physical Assessment:  There were no vitals filed for this visit.There is no height or weight on file to calculate BMI.   Physical Examination:  General appearance - well appearing, and in no distress Mental status - alert, oriented to person, place, and time Psych:  She has a normal mood and affect Skin - warm and dry, normal color, no suspicious lesions noted Chest - effort normal Heart - normal rate  Breasts - breasts appear normal, no suspicious masses, no skin or nipple changes or axillary nodes Abdomen - soft, nontender, nondistended, no masses or organomegaly Pelvic -  {Blank single:19197::Performed and:,declines,not indicated} VULVA: {Blank single:19197::Not examined,normal appearing vulva with no masses, tenderness or lesions,***} VAGINA: {Blank single:19197::Not examined,normal appearing vagina with normal color and discharge, no lesions,***} CERVIX: {Blank single:19197::Not examined,normal appearing cervix without discharge or lesions, no CMT.,***} UTERUS: {Blank single:19197::Not examined,uterus  is felt to be normal size, shape, consistency and nontender,***} ADNEXA: {Blank single:19197::Not examined,No adnexal masses or tenderness noted,***} Extremities:  No swelling or varicosities noted  Chaperone present for exam  No results found for this or any previous visit (from the past 24 hours).  Assessment & Plan:  Diagnoses and all orders for this visit:  Encounter for annual routine gynecological examination  Perimenopausal symptom  Low libido   Started on Paxil  and it is ***  Started buspar  and it is ***   - Cervical cancer screening: Discussed guidelines. Pap with HPV done - STI testing: {Blank single:19197::Not indicated,accepts,declines} - Breast Health: Encouraged self breast awareness/SBE. Discussed limits of clinical breast exam for detecting breast cancer. Discussed importance of annual MXR. Rx given for MXR - Birth control: {Birth control type:34026} - Climacteric/Sexual health: Reviewed typical and atypical symptoms of menopause/peri-menopause. Discussed PMB and to call if any amount of spotting.  - Colonoscopy: {Blank single:19197::Per PCP,up to date,declines} - F/U 12 months and prn    No orders of the defined types were placed in this encounter.   Meds: No orders of the defined types were placed in this encounter.   Follow-up: No follow-ups on file.  Vina Solian, MD 05/14/2024 10:27 AM   "

## 2024-05-16 ENCOUNTER — Ambulatory Visit: Admitting: Obstetrics and Gynecology

## 2024-05-16 DIAGNOSIS — Z01419 Encounter for gynecological examination (general) (routine) without abnormal findings: Secondary | ICD-10-CM

## 2024-05-16 DIAGNOSIS — R6882 Decreased libido: Secondary | ICD-10-CM

## 2024-05-16 DIAGNOSIS — N951 Menopausal and female climacteric states: Secondary | ICD-10-CM
